# Patient Record
Sex: Male | Born: 1990 | Hispanic: Yes | Marital: Single | Smoking: Never smoker
Health system: Southern US, Community
[De-identification: ages and names within clinical notes are randomized; demographics above are authoritative.]

## PROBLEM LIST (undated history)

## (undated) DIAGNOSIS — E739 Lactose intolerance, unspecified: Secondary | ICD-10-CM

## (undated) DIAGNOSIS — K760 Fatty (change of) liver, not elsewhere classified: Secondary | ICD-10-CM

## (undated) DIAGNOSIS — R001 Bradycardia, unspecified: Secondary | ICD-10-CM

## (undated) DIAGNOSIS — E669 Obesity, unspecified: Secondary | ICD-10-CM

## (undated) DIAGNOSIS — U071 COVID-19: Secondary | ICD-10-CM

## (undated) DIAGNOSIS — K589 Irritable bowel syndrome without diarrhea: Secondary | ICD-10-CM

## (undated) DIAGNOSIS — K219 Gastro-esophageal reflux disease without esophagitis: Secondary | ICD-10-CM

## (undated) HISTORY — DX: Gastro-esophageal reflux disease without esophagitis: K21.9

## (undated) HISTORY — DX: COVID-19: U07.1

## (undated) HISTORY — DX: Obesity, unspecified: E66.9

## (undated) HISTORY — DX: Lactose intolerance, unspecified: E73.9

## (undated) HISTORY — DX: Irritable bowel syndrome, unspecified: K58.9

## (undated) HISTORY — PX: WISDOM TOOTH EXTRACTION: SHX21

## (undated) HISTORY — DX: Bradycardia, unspecified: R00.1

## (undated) HISTORY — DX: Fatty (change of) liver, not elsewhere classified: K76.0

---

## 2017-03-25 ENCOUNTER — Ambulatory Visit (HOSPITAL_COMMUNITY)
Admission: EM | Admit: 2017-03-25 | Discharge: 2017-03-25 | Disposition: A | Discharge status: Home / Self Care | Payer: 59 | Attending: Family Medicine | Admitting: Family Medicine

## 2017-03-25 ENCOUNTER — Encounter (HOSPITAL_COMMUNITY): Payer: Self-pay | Admitting: *Deleted

## 2017-03-25 DIAGNOSIS — T148XXA Other injury of unspecified body region, initial encounter: Secondary | ICD-10-CM

## 2017-03-25 DIAGNOSIS — M940 Chondrocostal junction syndrome [Tietze]: Secondary | ICD-10-CM | POA: Insufficient documentation

## 2017-03-25 DIAGNOSIS — M25512 Pain in left shoulder: Secondary | ICD-10-CM | POA: Diagnosis not present

## 2017-03-25 DIAGNOSIS — R51 Headache: Secondary | ICD-10-CM | POA: Diagnosis present

## 2017-03-25 DIAGNOSIS — J029 Acute pharyngitis, unspecified: Secondary | ICD-10-CM | POA: Insufficient documentation

## 2017-03-25 DIAGNOSIS — I889 Nonspecific lymphadenitis, unspecified: Secondary | ICD-10-CM | POA: Insufficient documentation

## 2017-03-25 LAB — POCT RAPID STREP A: Streptococcus, Group A Screen (Direct): NEGATIVE

## 2017-03-25 NOTE — ED Triage Notes (Signed)
Pt  Reports   sorethroat  For   Several  Days    With  Pain  When he   Swallows  Pt  Also  Reports  Pain l  Upper  Chest   When  He   Coughs   And  When  He  Breathes  Out

## 2017-03-25 NOTE — ED Provider Notes (Signed)
[facility]    CSN: [id number] Arrival date & time: 03/25/17  1803     History   Chief Complaint Chief Complaint  Patient presents with  . Sore Throat    HPI Shawn Mcknight is a 26 y.o. male.   27 year old male presents to the urgent care with a history of sore throat for a little over a week. Also having some headache. He adds additional complaints of pain in the left shoulder which is actually in the left lateral neck. He has complaints of pain along the bilateral costal margin. He has a concern about a small nodule in the right anterior cervical lymph node chain. He has no nausea, vomiting, diarrhea, fever or chills.      History reviewed. No pertinent past medical history.  There are no active problems to display for this patient.   History reviewed. No pertinent surgical history.     Home Medications    Prior to Admission medications   Medication Sig Start Date End Date Taking? Authorizing Provider  Diclofenac Sodium (VOLTAREN PO) Take by mouth.   Yes [provider]    Family History History reviewed. No pertinent family history.  Social History Social History  Substance Use Topics  . Smoking status: Never Smoker  . Smokeless tobacco: Never Used  . Alcohol use No     Allergies   Patient has no known allergies.   Review of Systems Review of Systems  Constitutional: Negative.  Negative for activity change, diaphoresis, fatigue and fever.  HENT: Positive for sore throat. Negative for congestion, ear pain, facial swelling, hearing loss, postnasal drip, rhinorrhea and trouble swallowing.   Eyes: Negative.  Negative for pain, discharge and redness.  Respiratory: Positive for cough. Negative for chest tightness and shortness of breath.   Cardiovascular: Positive for chest pain.  Gastrointestinal: Negative.   Musculoskeletal: Positive for myalgias and neck pain. Negative for neck stiffness.  Neurological: Negative.   Hematological:  Positive for adenopathy.  All other systems reviewed and are negative.    Physical Exam Triage Vital Signs ED Triage Vitals  Enc Vitals Group     BP 03/25/17 1909 128/74     Pulse Rate 03/25/17 1909 78     Resp 03/25/17 1909 18     Temp 03/25/17 1909 98.6 F (37 C)     Temp Source 03/25/17 1909 Oral     SpO2 03/25/17 1909 99 %     Weight --      Height --      Head Circumference --      Peak Flow --      Pain Score 03/25/17 1911 5     Pain Loc --      Pain Edu? --      Excl. in [place]? --    No data found.   Updated Vital Signs BP 128/74 (BP Location: Right Arm)   Pulse 78   Temp 98.6 F (37 C) (Oral)   Resp 18   SpO2 99%   Visual Acuity Right Eye Distance:   Left Eye Distance:   Bilateral Distance:    Right Eye Near:   Left Eye Near:    Bilateral Near:     Physical Exam  Constitutional: He is oriented to person, place, and time. He appears well-developed and well-nourished. No distress.  HENT:  Head: Normocephalic and atraumatic.  Oropharynx with minor erythema. No exudates or swelling.  Eyes: EOM are normal.  Neck: Normal range of motion. Neck supple.  1 solitary small 1/2 cm node in the rightneck.   Cardiovascular: Normal rate, regular rhythm and normal heart sounds.   Pulmonary/Chest: Effort normal and breath sounds normal.  Musculoskeletal:  Tenderness along the left scalene muscle. This is made worse by having him rotate to the right into elicited to the right. Palpation of this scalene muscle it causes tenderness and reproduces the pain.  Palpation of the bilateral lower costal margins reproduces the pain for which he complains.  Neurological: He is alert and oriented to person, place, and time.  Skin: Skin is dry.  Psychiatric: He has a normal mood and affect.  Nursing note and vitals reviewed.    UC Treatments / Results  Labs (all labs ordered are listed, but only abnormal results are displayed) Labs Reviewed  POCT RAPID STREP A    EKG   EKG Interpretation None       Radiology No results found.  Procedures Procedures (including critical care time)  Medications Ordered in UC Medications - No data to display   Initial Impression / Assessment and Plan / UC Course  I have reviewed the triage vital signs and the nursing notes.  Pertinent labs & imaging results that were available during my care of the patient were reviewed by me and considered in my medical decision making (see chart for details).    Strep test is negative. You likely have a viral sore throat. It will run its course. For pain he may take ibuprofen 600 mg every 6 hours, Cepacol lozenges for sore throat pain. If he do not have stomach problems the ibuprofen should also help with the inflammation of the chest wall in the muscle in your neck. Apply heat to your neck periodically. Apply ice to the ribs.     Final Clinical Impressions(s) / UC Diagnoses   Final diagnoses:  Sore throat  Costochondritis  Muscle strain  Lymphadenitis    New Prescriptions New Prescriptions   No medications on file     Controlled Substance Prescriptions [place] Controlled Substance Registry consulted? Not Applicable   Janne Napoleon, NP 03/25/17 [phone]

## 2017-03-25 NOTE — Discharge Instructions (Signed)
Strep test is negative. You likely have a viral sore throat. It will run its course. For pain he may take ibuprofen 600 mg every 6 hours, Cepacol lozenges for sore throat pain. If he do not have stomach problems the ibuprofen should also help with the inflammation of the chest wall in the muscle in your neck. Apply heat to your neck periodically. Apply ice to the ribs.

## 2017-03-28 LAB — CULTURE, GROUP A STREP (THRC)

## 2017-04-15 ENCOUNTER — Encounter (HOSPITAL_COMMUNITY): Payer: Self-pay | Admitting: *Deleted

## 2017-04-15 ENCOUNTER — Ambulatory Visit (HOSPITAL_COMMUNITY)
Admission: EM | Admit: 2017-04-15 | Discharge: 2017-04-15 | Disposition: A | Discharge status: Home / Self Care | Payer: 59 | Attending: Nurse Practitioner | Admitting: Nurse Practitioner

## 2017-04-15 DIAGNOSIS — G43011 Migraine without aura, intractable, with status migrainosus: Secondary | ICD-10-CM | POA: Diagnosis not present

## 2017-04-15 DIAGNOSIS — G43009 Migraine without aura, not intractable, without status migrainosus: Secondary | ICD-10-CM | POA: Diagnosis not present

## 2017-04-15 MED ORDER — DIPHENHYDRAMINE HCL 50 MG/ML IJ SOLN
12.5000 mg | Freq: Once | INTRAMUSCULAR | Status: AC
  Administered 2017-04-15: 12.5 mg via INTRAMUSCULAR

## 2017-04-15 MED ORDER — METOCLOPRAMIDE HCL 5 MG/ML IJ SOLN
10.0000 mg | Freq: Once | INTRAMUSCULAR | Status: AC
  Administered 2017-04-15: 10 mg via INTRAMUSCULAR

## 2017-04-15 MED ORDER — DIPHENHYDRAMINE HCL 50 MG/ML IJ SOLN
INTRAMUSCULAR | Status: AC
  Filled 2017-04-15: qty 1

## 2017-04-15 MED ORDER — KETOROLAC TROMETHAMINE 60 MG/2ML IM SOLN
60.0000 mg | Freq: Once | INTRAMUSCULAR | Status: AC
  Administered 2017-04-15: 60 mg via INTRAMUSCULAR

## 2017-04-15 MED ORDER — METOCLOPRAMIDE HCL 5 MG/ML IJ SOLN
INTRAMUSCULAR | Status: AC
Start: 2017-04-15 — End: 2017-04-15
  Filled 2017-04-15: qty 2

## 2017-04-15 MED ORDER — KETOROLAC TROMETHAMINE 60 MG/2ML IM SOLN
INTRAMUSCULAR | Status: AC
Start: 2017-04-15 — End: 2017-04-15
  Filled 2017-04-15: qty 2

## 2017-04-15 NOTE — ED Provider Notes (Addendum)
[place]    CSN: [id number] Arrival date & time: 04/15/17  1737     History   Chief Complaint Chief Complaint  Patient presents with  . Headache    HPI Shawn Mcknight is a 26 y.o. male.   Subjective:  Shawn Mcknight is a 26 y.o. male who presents for evaluation of headache. Symptoms began about 4 days ago. Rapidity of onset was gradual. The patient gets headaches rarely. The headache is described as squeezing and throbbing and is occipital, frontal in location.  The patient rates the pain a 8 on a scale from 1 to 10. Precipitating factors include none which have been determined. The headache was not preceded by an aura. Associated neurologic symptoms which are present include dizziness and vision problems. The patient denies loss of balance, muscle weakness, numbness of extremities and speech difficulties. Other associated symptoms include nausea and photophobia.  Patient denies abdominal pain, chest pain, earache, fever, neck stiffness, rash, rhinorrhea and vomiting. Home treatment has included acetaminophen with inadequate improvement. Other history includes: nothing pertinent. Family history includes no known family members with significant headaches.  The following portions of the patient's history were reviewed and updated as appropriate: allergies, current medications, past family history, past medical history, past social history, past surgical history and problem list.         History reviewed. No pertinent past medical history.  There are no active problems to display for this patient.   History reviewed. No pertinent surgical history.     Home Medications    Prior to Admission medications   Medication Sig Start Date End Date Taking? Authorizing Provider  Diclofenac Sodium (VOLTAREN PO) Take by mouth.    [provider]    Family History History reviewed. No pertinent family history.  Social History Social History  Substance Use  Topics  . Smoking status: Never Smoker  . Smokeless tobacco: Never Used  . Alcohol use No     Allergies   Patient has no known allergies.   Review of Systems Review of Systems  Constitutional: Negative for fever.  HENT: Negative for ear pain, sinus pain and sinus pressure.   Eyes: Negative for pain.  Musculoskeletal: Negative for neck pain and neck stiffness.  Skin: Negative for rash.  Neurological: Positive for light-headedness and headaches. Negative for dizziness, tremors, speech difficulty, weakness and numbness.  All other systems reviewed and are negative.    Physical Exam Triage Vital Signs ED Triage Vitals  Enc Vitals Group     BP 04/15/17 1800 132/78     Pulse Rate 04/15/17 1800 78     Resp 04/15/17 1800 18     Temp 04/15/17 1800 98.6 F (37 C)     Temp Source 04/15/17 1800 Oral     SpO2 04/15/17 1800 100 %     Weight --      Height --      Head Circumference --      Peak Flow --      Pain Score 04/15/17 1802 6     Pain Loc --      Pain Edu? --      Excl. in [place]? --    No data found.   Updated Vital Signs BP 132/78 (BP Location: Right Arm)   Pulse 78   Temp 98.6 F (37 C) (Oral)   Resp 18   SpO2 100%   Visual Acuity Right Eye Distance:   Left Eye Distance:   Bilateral Distance:  Right Eye Near:   Left Eye Near:    Bilateral Near:     Physical Exam  Constitutional: He is oriented to person, place, and time. He appears well-developed and well-nourished.  HENT:  Head: Normocephalic.  Right Ear: External ear normal.  Left Ear: External ear normal.  Eyes: Pupils are equal, round, and reactive to light. Conjunctivae and EOM are normal.  Neck: Normal range of motion. Neck supple.  Cardiovascular: Normal rate, regular rhythm and normal heart sounds.   Pulmonary/Chest: Effort normal and breath sounds normal.  Musculoskeletal: Normal range of motion.  Neurological: He is alert and oriented to person, place, and time.  Skin: Skin is warm and  dry.  Psychiatric: He has a normal mood and affect.     UC Treatments / Results  Labs (all labs ordered are listed, but only abnormal results are displayed) Labs Reviewed - No data to display  EKG  EKG Interpretation None       Radiology No results found.  Procedures Procedures (including critical care time)  Medications Ordered in UC Medications  ketorolac (TORADOL) injection 60 mg (not administered)  metoCLOPramide (REGLAN) injection 10 mg (not administered)  diphenhydrAMINE (BENADRYL) injection 12.5 mg (not administered)     Initial Impression / Assessment and Plan / UC Course  I have reviewed the triage vital signs and the nursing notes.  Pertinent labs & imaging results that were available during my care of the patient were reviewed by me and considered in my medical decision making (see chart for details).   26 year old male with no significant medical history that presents with a four-day history of headache likely due to complex migraine. No systemic symptoms, focal neuro logical deficits, head trauma, toxic exposures or previous significant headache history. Low suspicion for meningitis. No indication for imaging or LP at this time. Treated in clinic with Toradol, benadryl and Reglan with significant improvement in symptoms.   Discussed diagnosis and treatment with patient. All questions have been answered and all concerns have been addressed. The patient verbalized understanding and had no further questions   Addendum 1917: Patient was treated in the clinic as above with significant improvement in his symptoms. He was discharged in stable condition. Within minutes of leaving the clinic, patient returns stating that his headache is back. Advised patient to go to the ED for further evaluation. He is alert and oriented 3. No focal deficits noted. Ambulatory with steady gait.  Final Clinical Impressions(s) / UC Diagnoses   Final diagnoses:  Intractable migraine  without aura and with status migrainosus    New Prescriptions New Prescriptions   No medications on file     Controlled Substance Prescriptions [place] Controlled Substance Registry consulted? Not Applicable   Enrique Sack, [place] 04/15/17 [place], [place], [place] 04/15/17 [phone]

## 2017-04-15 NOTE — ED Triage Notes (Signed)
Pt   Reports    Headache      X  4  Days       With  Some  Nausea      Pt   Reports     Symptoms   Not  releived    By   otc      meds       He  Is   Awake  And  Alert

## 2019-06-07 ENCOUNTER — Encounter (HOSPITAL_COMMUNITY): Payer: Self-pay

## 2019-06-07 ENCOUNTER — Ambulatory Visit (HOSPITAL_COMMUNITY)
Admission: EM | Admit: 2019-06-07 | Discharge: 2019-06-07 | Disposition: A | Discharge status: Home / Self Care | Payer: PRIVATE HEALTH INSURANCE | Attending: Family Medicine | Admitting: Family Medicine

## 2019-06-07 ENCOUNTER — Other Ambulatory Visit: Payer: Self-pay

## 2019-06-07 ENCOUNTER — Ambulatory Visit (INDEPENDENT_AMBULATORY_CARE_PROVIDER_SITE_OTHER): Payer: PRIVATE HEALTH INSURANCE

## 2019-06-07 DIAGNOSIS — R1030 Lower abdominal pain, unspecified: Secondary | ICD-10-CM

## 2019-06-07 DIAGNOSIS — K5900 Constipation, unspecified: Secondary | ICD-10-CM | POA: Diagnosis present

## 2019-06-07 DIAGNOSIS — K219 Gastro-esophageal reflux disease without esophagitis: Secondary | ICD-10-CM | POA: Insufficient documentation

## 2019-06-07 LAB — CBC WITH DIFFERENTIAL/PLATELET
Abs Immature Granulocytes: 0.05 10*3/uL (ref 0.00–0.07)
Basophils Absolute: 0 10*3/uL (ref 0.0–0.1)
Basophils Relative: 1 %
Eosinophils Absolute: 0.1 10*3/uL (ref 0.0–0.5)
Eosinophils Relative: 2 %
HCT: 48.7 % (ref 39.0–52.0)
Hemoglobin: 16.4 g/dL (ref 13.0–17.0)
Immature Granulocytes: 1 %
Lymphocytes Relative: 29 %
Lymphs Abs: 1.9 10*3/uL (ref 0.7–4.0)
MCH: 29.7 pg (ref 26.0–34.0)
MCHC: 33.7 g/dL (ref 30.0–36.0)
MCV: 88.2 fL (ref 80.0–100.0)
Monocytes Absolute: 0.5 10*3/uL (ref 0.1–1.0)
Monocytes Relative: 8 %
Neutro Abs: 3.8 10*3/uL (ref 1.7–7.7)
Neutrophils Relative %: 59 %
Platelets: 274 10*3/uL (ref 150–400)
RBC: 5.52 MIL/uL (ref 4.22–5.81)
RDW: 12.6 % (ref 11.5–15.5)
WBC: 6.4 10*3/uL (ref 4.0–10.5)
nRBC: 0 % (ref 0.0–0.2)

## 2019-06-07 LAB — COMPREHENSIVE METABOLIC PANEL
ALT: 311 U/L — ABNORMAL HIGH (ref 0–44)
AST: 157 U/L — ABNORMAL HIGH (ref 15–41)
Albumin: 4.1 g/dL (ref 3.5–5.0)
Alkaline Phosphatase: 85 U/L (ref 38–126)
Anion gap: 11 (ref 5–15)
BUN: 13 mg/dL (ref 6–20)
CO2: 26 mmol/L (ref 22–32)
Calcium: 9.6 mg/dL (ref 8.9–10.3)
Chloride: 102 mmol/L (ref 98–111)
Creatinine, Ser: 0.94 mg/dL (ref 0.61–1.24)
GFR calc Af Amer: >60 mL/min (ref >60)
GFR calc non Af Amer: >60 mL/min (ref >60)
Glucose, Bld: 104 mg/dL — ABNORMAL HIGH (ref 70–99)
Potassium: 3.9 mmol/L (ref 3.5–5.1)
Sodium: 139 mmol/L (ref 135–145)
Total Bilirubin: 1 mg/dL (ref 0.3–1.2)
Total Protein: 7.3 g/dL (ref 6.5–8.1)

## 2019-06-07 LAB — LIPASE, BLOOD: Lipase: 34 U/L (ref 11–51)

## 2019-06-07 MED ORDER — OMEPRAZOLE 40 MG PO CPDR: 40.0000 mg | DELAYED_RELEASE_CAPSULE | Freq: Every day | ORAL | 0 refills | Status: DC

## 2019-06-07 MED ORDER — SIMETHICONE 125 MG PO CHEW: 125.0000 mg | CHEWABLE_TABLET | Freq: Four times a day (QID) | ORAL | 0 refills | Status: DC | PRN

## 2019-06-07 MED ORDER — POLYETHYLENE GLYCOL 3350 17 GM/SCOOP PO POWD: 17.0000 g | Freq: Every day | ORAL | 0 refills | Status: DC
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
## 2019-06-07 NOTE — ED Provider Notes (Signed)
[facility]    CSN: [id number] Arrival date & time: 06/07/19  [id number]      History   Chief Complaint Chief Complaint  Patient presents with  . Abdominal Pain    HPI Shawn Mcknight is a 28 y.o. male.   Shawn Mcknight presents with complaints of abdominal pain which started 11/8. States he developed upper abdominal pain after eating salsa initially. Since then he has had general abdominal pain, and now worse to lower abdomen, as well as straining to pass stool. Stool has been yellow mucus in color. Only small amount of stool. He feels constipated. Yesterday felt nauseated. No current nausea. No vomiting. Wasn't able to eat due to nausea. No back pain. No blood or black in stool. No urinary symptoms. Two days ago he felt chills and worse with sitting upright, tmax of 99. Pain 5/10 in severity. No body aches or URI symptoms. Hasn't taken any medications for symptoms. States he was told he has colitis in the past by his physician in [place], he has not been able to follow with them, unfortunately. He states he was prescribed a medication called Alevian but he doesn't have this currently. It looks like, by online search, this is a simethicone medication. Patient feels bloated. He is currently working from home.    ROS per HPI, negative if not otherwise mentioned.      History reviewed. No pertinent past medical history.  There are no active problems to display for this patient.   Past Surgical History:  Procedure Laterality Date  . WISDOM TOOTH EXTRACTION         Home Medications    Prior to Admission medications   Medication Sig Start Date End Date Taking? Authorizing Provider  Diclofenac Sodium (VOLTAREN PO) Take by mouth.    [provider]  omeprazole (PRILOSEC) 40 MG capsule Take 1 capsule (40 mg total) by mouth daily. 06/07/19 07/07/19  Augusto Gamble B, NP  polyethylene glycol powder (GLYCOLAX/MIRALAX) 17 GM/SCOOP powder Take 17 g by mouth daily.  06/07/19   Zigmund Gottron, NP  simethicone (MYLICON) 0000000 MG chewable tablet Chew 1 tablet (125 mg total) by mouth every 6 (six) hours as needed for flatulence (bloating). 06/07/19   Zigmund Gottron, NP    Family History Family History  Problem Relation Age of Onset  . Hypertension Mother   . Healthy Father   . Cancer Sister   . Thyroid disease Sister     Social History Social History   Tobacco Use  . Smoking status: Never Smoker  . Smokeless tobacco: Never Used  Substance Use Topics  . Alcohol use: No  . Drug use: Not on file     Allergies   Patient has no known allergies.   Review of Systems Review of Systems   Physical Exam Triage Vital Signs ED Triage Vitals  Enc Vitals Group     BP 06/07/19 0913 132/60     Pulse Rate 06/07/19 0913 64     Resp 06/07/19 0913 16     Temp 06/07/19 0913 97.9 F (36.6 C)     Temp Source 06/07/19 0913 Temporal     SpO2 06/07/19 0913 99 %     Weight 06/07/19 0925 238 lb 1.6 oz (108 kg)     Height --      Head Circumference --      Peak Flow --      Pain Score 06/07/19 0923 6     Pain Loc --  Pain Edu? --      Excl. in [place]? --    No data found.  Updated Vital Signs BP 132/60 (BP Location: Left Arm)   Pulse 64   Temp 97.9 F (36.6 C) (Temporal)   Resp 16   Wt 238 lb 1.6 oz (108 kg)   SpO2 99%    Physical Exam Constitutional:      Appearance: He is well-developed. He is obese.  Cardiovascular:     Rate and Rhythm: Normal rate.  Pulmonary:     Effort: Pulmonary effort is normal.  Abdominal:     General: Bowel sounds are decreased.     Palpations: Abdomen is soft.     Tenderness: There is abdominal tenderness in the right lower quadrant, suprapubic area, left upper quadrant and left lower quadrant. There is no right CVA tenderness, left CVA tenderness or rebound.  Skin:    General: Skin is warm and dry.  Neurological:     Mental Status: He is alert and oriented to person, place, and time.      UC  Treatments / Results  Labs (all labs ordered are listed, but only abnormal results are displayed) Labs Reviewed  COMPREHENSIVE METABOLIC PANEL  CBC WITH DIFFERENTIAL/PLATELET  LIPASE, BLOOD    EKG   Radiology Dg Abd 2 Views  Result Date: 06/07/2019 CLINICAL DATA:  Abdominal pain, constipation. EXAM: ABDOMEN - 2 VIEW COMPARISON:  None. FINDINGS: The bowel gas pattern is normal. Moderate amount of stool seen in the colon. There is no evidence of free air. No radio-opaque calculi or other significant radiographic abnormality is seen. IMPRESSION: No evidence of bowel obstruction or ileus. Moderate stool burden is noted. Electronically Signed   By: Marijo Conception M.D.   On: 06/07/2019 10:03    Procedures Procedures (including critical care time)  Medications Ordered in UC Medications - No data to display  Initial Impression / Assessment and Plan / UC Course  I have reviewed the triage vital signs and the nursing notes.  Pertinent labs & imaging results that were available during my care of the patient were reviewed by me and considered in my medical decision making (see chart for details).     Non toxic in appearance. Afebrile, no tachycardia. Non specific and mild abdominal pain, generalized to lower abdomen with mild LUQ pain. No vomiting. Non obstructive pattern on xray, with some stool burden, consistent with complaint of constipation. Symptoms of gerd as well. Simethicone has worked in the past, refilled here today. miralax and omeprazole also recommended. CBC, CMP and lipase pending. Will call patient with any concerning findings. Return precautions provided.  Encouraged establish with pcp. Patient verbalized understanding and agreeable to plan.  Ambulatory out of clinic without difficulty.    Final Clinical Impressions(s) / UC Diagnoses   Final diagnoses:  Gastroesophageal reflux disease, unspecified whether esophagitis present  Constipation, unspecified constipation type   Lower abdominal pain     Discharge Instructions     Your xray is reassuring, you do have a fair amount of stool present, however.  Miralax daily,  may use twice today if needed to promote large bowel movement.  Drink plenty of water, approximately 8 glasses a day.  May use simethicone chews as needed for bloating or gas.  Please start omeprazole daily to help with upper abdominal pain as well, see diet recommendations.  I will call you if I am concerned about your lab testing.  Please call to establish care with a primary care  provider to establish care and for recheck and management long term.  Any worsening of pain, fevers, blood or black in stool or otherwise worsening please go to the ER.     ED Prescriptions    Medication Sig Dispense Auth. Provider   simethicone (MYLICON) 0000000 MG chewable tablet Chew 1 tablet (125 mg total) by mouth every 6 (six) hours as needed for flatulence (bloating). 30 tablet Augusto Gamble B, NP   omeprazole (PRILOSEC) 40 MG capsule Take 1 capsule (40 mg total) by mouth daily. 30 capsule Augusto Gamble B, NP   polyethylene glycol powder (GLYCOLAX/MIRALAX) 17 GM/SCOOP powder Take 17 g by mouth daily. 255 g Zigmund Gottron, NP     PDMP not reviewed this encounter.   Zigmund Gottron, NP 06/07/19 1025

## 2019-06-07 NOTE — Discharge Instructions (Signed)
Your xray is reassuring, you do have a fair amount of stool present, however.  Miralax daily,  may use twice today if needed to promote large bowel movement.  Drink plenty of water, approximately 8 glasses a day.  May use simethicone chews as needed for bloating or gas.  Please start omeprazole daily to help with upper abdominal pain as well, see diet recommendations.  I will call you if I am concerned about your lab testing.  Please call to establish care with a primary care provider to establish care and for recheck and management long term.  Any worsening of pain, fevers, blood or black in stool or otherwise worsening please go to the ER.

## 2019-06-09 ENCOUNTER — Telehealth (HOSPITAL_COMMUNITY): Payer: Self-pay | Admitting: Emergency Medicine

## 2019-06-09 NOTE — Telephone Encounter (Signed)
Elevated liver enzymes. Spoke to [employer], pt has appt with PCP in December. Per Lanelle Bal, pt is okay to wait unless he gets worse.  If pt has significant abdominal pain, having fevers, go to ER otherwise follow up with PCP.  Patient contacted and made aware of    results. Pt verbalized understanding and had all questions answered.

## 2019-07-03 ENCOUNTER — Encounter: Payer: Self-pay | Admitting: Family Medicine

## 2019-07-03 ENCOUNTER — Other Ambulatory Visit: Payer: Self-pay | Admitting: Family Medicine

## 2019-07-03 ENCOUNTER — Other Ambulatory Visit: Payer: Self-pay

## 2019-07-03 ENCOUNTER — Ambulatory Visit (INDEPENDENT_AMBULATORY_CARE_PROVIDER_SITE_OTHER): Payer: 59 | Admitting: Family Medicine

## 2019-07-03 VITALS — BP 138/82 | HR 63 | Temp 96.0°F | Ht 65.0 in | Wt 239.0 lb

## 2019-07-03 DIAGNOSIS — R7989 Other specified abnormal findings of blood chemistry: Secondary | ICD-10-CM

## 2019-07-03 DIAGNOSIS — S93409A Sprain of unspecified ligament of unspecified ankle, initial encounter: Secondary | ICD-10-CM | POA: Insufficient documentation

## 2019-07-03 DIAGNOSIS — S93402A Sprain of unspecified ligament of left ankle, initial encounter: Secondary | ICD-10-CM | POA: Diagnosis not present

## 2019-07-03 DIAGNOSIS — R1011 Right upper quadrant pain: Secondary | ICD-10-CM | POA: Diagnosis not present

## 2019-07-03 LAB — BASIC METABOLIC PANEL
BUN: 17 mg/dL (ref 6–23)
CO2: 26 mEq/L (ref 19–32)
Calcium: 10.1 mg/dL (ref 8.4–10.5)
Chloride: 102 mEq/L (ref 96–112)
Creatinine, Ser: 0.89 mg/dL (ref 0.40–1.50)
GFR: 101.84 mL/min (ref >60.00)
Glucose, Bld: 102 mg/dL — ABNORMAL HIGH (ref 70–99)
Potassium: 4.6 mEq/L (ref 3.5–5.1)
Sodium: 140 mEq/L (ref 135–145)

## 2019-07-03 LAB — HEPATIC FUNCTION PANEL
ALT: 267 U/L — ABNORMAL HIGH (ref 0–53)
AST: 91 U/L — ABNORMAL HIGH (ref 0–37)
Albumin: 4.8 g/dL (ref 3.5–5.2)
Alkaline Phosphatase: 99 U/L (ref 39–117)
Bilirubin, Direct: 0.1 mg/dL (ref 0.0–0.3)
Total Bilirubin: 0.6 mg/dL (ref 0.2–1.2)
Total Protein: 7.3 g/dL (ref 6.0–8.3)

## 2019-07-03 NOTE — Assessment & Plan Note (Addendum)
Instructed to continue light exercises/stretches and use of brace.  Ice as needed.  Call if not improving over the next few weeks.

## 2019-07-03 NOTE — Assessment & Plan Note (Signed)
Update LFT's today  RUQ [place] ordered as he does have some tenderness over this area.

## 2019-07-03 NOTE — Patient Instructions (Signed)
Gallbladder Eating Plan °If you have a gallbladder condition, you may have trouble digesting fats. Eating a low-fat diet can help reduce your symptoms, and may be helpful before and after having surgery to remove your gallbladder (cholecystectomy). Your health care provider may recommend that you work with a diet and nutrition specialist (dietitian) to help you reduce the amount of fat in your diet. °What are tips for following this plan? °General guidelines °· Limit your fat intake to less than 30% of your total daily calories. If you eat around 1,800 calories each day, this is less than 60 grams (g) of fat per day. °· Fat is an important part of a healthy diet. Eating a low-fat diet can make it hard to maintain a healthy body weight. Ask your dietitian how much fat, calories, and other nutrients you need each day. °· Eat small, frequent meals throughout the day instead of three large meals. °· Drink at least 8-10 cups of fluid a day. Drink enough fluid to keep your urine clear or pale yellow. °· Limit alcohol intake to no more than 1 drink a day for nonpregnant women and 2 drinks a day for men. One drink equals 12 oz of beer, 5 oz of wine, or 1½ oz of hard liquor. °Reading food labels °· Check Nutrition Facts on food labels for the amount of fat per serving. Choose foods with less than 3 grams of fat per serving. °Shopping °· Choose nonfat and low-fat healthy foods. Look for the words “nonfat,” “low fat,” or “fat free.” °· Avoid buying processed or prepackaged foods. °Cooking °· Cook using low-fat methods, such as baking, broiling, grilling, or boiling. °· Cook with small amounts of healthy fats, such as olive oil, grapeseed oil, canola oil, or sunflower oil. °What foods are recommended? ° °· All fresh, frozen, or canned fruits and vegetables. °· Whole grains. °· Low-fat or non-fat (skim) milk and yogurt. °· Lean meat, skinless poultry, fish, eggs, and beans. °· Low-fat protein supplement powders or  drinks. °· Spices and herbs. °What foods are not recommended? °· High-fat foods. These include baked goods, fast food, fatty cuts of meat, ice cream, french toast, sweet rolls, pizza, cheese bread, foods covered with butter, creamy sauces, or cheese. °· Fried foods. These include french fries, tempura, battered fish, breaded chicken, fried breads, and sweets. °· Foods with strong odors. °· Foods that cause bloating and gas. °Summary °· A low-fat diet can be helpful if you have a gallbladder condition, or before and after gallbladder surgery. °· Limit your fat intake to less than 30% of your total daily calories. This is about 60 g of fat if you eat 1,800 calories each day. °· Eat small, frequent meals throughout the day instead of three large meals. °This information is not intended to replace advice given to you by your health care provider. Make sure you discuss any questions you have with your health care provider. °Document Released: 07/18/2013 Document Revised: 11/03/2018 Document Reviewed: 08/20/2016 °Elsevier Patient Education © 2020 [employer] ° °

## 2019-07-03 NOTE — Progress Notes (Signed)
Shawn Mcknight - 28 y.o. male MRN [id number]  Date of birth: 1991-07-27  Subjective Chief Complaint  Patient presents with  . New Patient (Initial Visit)    Establish Care - discuss recent abn labs (liver fx)    HPI Shawn Mcknight is a 28 y.o. male here today for follow up of recent urgent care visit.  He was seen last month for complaint of constipation.  Labs showed elevated LFT's.  Lipase was normal at that time.  He has had some pain throughout his abdomen, most of this has resolved with treating his constipation.  He continues to have some RUQ pain.  This is made worse by fatty foods, especially cheese.  He think he may have lactose intolerance causing this.   He denies fever, chills, jaundice, dark urine, herbal supplements, excess tylenol use or EtOH use.    Also has L ankle pain.  Reports rolling his ankle about 1 week ago.  Mild swelling and still with some pain.  He is able to weight bear and walk without difficulty.  He is using an elastic ankle brace.   ROS:  A comprehensive ROS was completed and negative except as noted per HPI  Allergies  Allergen Reactions  . Lactose Intolerance (Gi)   . Shellfish Allergy Swelling    Past Medical History:  Diagnosis Date  . Bradycardia   . Lactose intolerance     Past Surgical History:  Procedure Laterality Date  . WISDOM TOOTH EXTRACTION      Social History   Socioeconomic History  . Marital status: Significant Other    Spouse name: Not on file  . Number of children: Not on file  . Years of education: Not on file  . Highest education level: Not on file  Occupational History  . Not on file  Social Needs  . Financial resource strain: Not on file  . Food insecurity    Worry: Not on file    Inability: Not on file  . Transportation needs    Medical: Not on file    Non-medical: Not on file  Tobacco Use  . Smoking status: Never Smoker  . Smokeless tobacco: Never Used  Substance and Sexual Activity  . Alcohol use: No  .  Drug use: Never  . Sexual activity: Yes  Lifestyle  . Physical activity    Days per week: Not on file    Minutes per session: Not on file  . Stress: Not on file  Relationships  . Social Herbalist on phone: Not on file    Gets together: Not on file    Attends religious service: Not on file    Active member of club or organization: Not on file    Attends meetings of clubs or organizations: Not on file    Relationship status: Not on file  Other Topics Concern  . Not on file  Social History Narrative  . Not on file    Family History  Problem Relation Age of Onset  . Hypertension Mother   . Healthy Father   . Thyroid disease Sister   . Thyroid cancer Sister     Health Maintenance  Topic Date Due  . HIV Screening  08/01/2006  . TETANUS/TDAP  08/01/2010  . INFLUENZA VACCINE  02/25/2019    ----------------------------------------------------------------------------------------------------------------------------------------------------------------------------------------------------------------- Physical Exam BP 138/82 (BP Location: Right Arm, Patient Position: Sitting, Cuff Size: Large)   Pulse 63   Temp (!) 96 F (35.6 C) (Temporal)   Ht 5'  5" (1.651 m)   Wt 239 lb (108.4 kg)   SpO2 97%   BMI 39.77 kg/m   Physical Exam Constitutional:      Appearance: Normal appearance.  HENT:     Head: Normocephalic and atraumatic.  Eyes:     General: No scleral icterus. Cardiovascular:     Rate and Rhythm: Normal rate and regular rhythm.  Pulmonary:     Effort: Pulmonary effort is normal.     Breath sounds: Normal breath sounds.  Abdominal:     General: Abdomen is flat. There is no distension.     Palpations: Abdomen is soft. There is no mass.     Tenderness: There is abdominal tenderness (RUQ). There is no guarding.  Musculoskeletal:     Comments: Mild swelling of L ankle. ROM of L ankle is normal.  Mild pain with inversion.    Skin:    General: Skin is warm  and dry.  Neurological:     General: No focal deficit present.     Mental Status: He is alert.  Psychiatric:        Mood and Affect: Mood normal.        Behavior: Behavior normal.     ------------------------------------------------------------------------------------------------------------------------------------------------------------------------------------------------------------------- Assessment and Plan  Elevated LFTs Update LFT's today  RUQ [place] ordered as he does have some tenderness over this area.  Ankle sprain Instructed to continue light exercises/stretches and use of brace.  Ice as needed.  Call if not improving over the next few weeks.

## 2019-07-04 ENCOUNTER — Encounter: Payer: Self-pay | Admitting: Family Medicine

## 2019-07-11 ENCOUNTER — Telehealth: Payer: Self-pay | Admitting: Family Medicine

## 2019-07-11 NOTE — Telephone Encounter (Signed)
Amin, please check on order that was placed on Dec. 7th  Copied from [place] [phone] [id number]. Topic: Referral - Status >> Jul 11, 2019  2:59 PM Lennox Solders wrote: Reason for CRM: Pt is calling checking on the status of abd ultrasound

## 2019-07-13 ENCOUNTER — Other Ambulatory Visit: Payer: Self-pay

## 2019-07-13 ENCOUNTER — Ambulatory Visit (HOSPITAL_BASED_OUTPATIENT_CLINIC_OR_DEPARTMENT_OTHER)
Admission: RE | Admit: 2019-07-13 | Discharge: 2019-07-13 | Disposition: A | Discharge status: Home / Self Care | Payer: 59 | Source: Ambulatory Visit | Attending: Family Medicine | Admitting: Family Medicine

## 2019-07-13 DIAGNOSIS — R1011 Right upper quadrant pain: Secondary | ICD-10-CM

## 2019-07-13 DIAGNOSIS — R7989 Other specified abnormal findings of blood chemistry: Secondary | ICD-10-CM | POA: Diagnosis present

## 2019-07-17 ENCOUNTER — Encounter: Payer: Self-pay | Admitting: Family Medicine

## 2019-07-18 NOTE — Telephone Encounter (Signed)
Pt scheduled for vv  

## 2019-07-18 NOTE — Telephone Encounter (Signed)
Ok to schedule virtual visit to discuss results.

## 2019-07-19 ENCOUNTER — Encounter: Payer: Self-pay | Admitting: Family Medicine

## 2019-07-19 ENCOUNTER — Telehealth (INDEPENDENT_AMBULATORY_CARE_PROVIDER_SITE_OTHER): Payer: 59 | Admitting: Family Medicine

## 2019-07-19 VITALS — HR 91 | Ht 65.0 in | Wt 233.0 lb

## 2019-07-19 DIAGNOSIS — R7989 Other specified abnormal findings of blood chemistry: Secondary | ICD-10-CM | POA: Diagnosis not present

## 2019-07-19 MED ORDER — OMEPRAZOLE 40 MG PO CPDR: 40.0000 mg | DELAYED_RELEASE_CAPSULE | Freq: Every day | ORAL | 1 refills | Status: DC

## 2019-07-19 NOTE — Assessment & Plan Note (Signed)
[place] consistent with NAFLD however enzymes are a little higher than what I would expect with this. Discussed low fat diet with weight loss.  Avoidance of any hepatotoxic substances (he denies EtOH, herbals, tylenol).  We'll recheck LFT's again in 2 weeks to be sure these are down-trending as well and will check hepatitis panel at that time.  If these remain elevated we discussed referral to GI.

## 2019-07-19 NOTE — Progress Notes (Signed)
Shawn Mcknight - 28 y.o. male MRN [id number]  Date of birth: 15-Jan-1991   This visit type was conducted due to national recommendations for restrictions regarding the COVID-19 Pandemic (e.g. social distancing).  This format is felt to be most appropriate for this patient at this time.  All issues noted in this document were discussed and addressed.  No physical exam was performed (except for noted visual exam findings with Video Visits).  I discussed the limitations of evaluation and management by telemedicine and the availability of in person appointments. The patient expressed understanding and agreed to proceed.  I connected with@ on 07/19/19 at 10:20 AM EST by a video enabled telemedicine application and verified that I am speaking with the correct person using two identifiers.  Present at visit: [name], DO [name]   Patient Location: Home [place] [address]   Provider location:   [place]  Chief Complaint  Patient presents with  . U/S report    HPI  Shawn Mcknight is a 28 y.o. male who presents via audio/video conferencing for a telehealth visit today.  He is following up on results of abdominal US.  Abd [place] ordered for elevated LFT's.  Results show findings consistent with NAFLD but no other abnormalities.  He reports that he has felt fine recently.  LFT's were trending down at last visit.  He denies any abdominal pain, nausea, changes to bowels, jaundice.     ROS:  A comprehensive ROS was completed and negative except as noted per HPI  Past Medical History:  Diagnosis Date  . Bradycardia   . Lactose intolerance     Past Surgical History:  Procedure Laterality Date  . WISDOM TOOTH EXTRACTION      Family History  Problem Relation Age of Onset  . Hypertension Mother   . Healthy Father   . Thyroid disease Sister   . Thyroid cancer Sister     Social History   Socioeconomic History  . Marital status:  Significant Other    Spouse name: Not on file  . Number of children: Not on file  . Years of education: Not on file  . Highest education level: Not on file  Occupational History  . Not on file  Tobacco Use  . Smoking status: Never Smoker  . Smokeless tobacco: Never Used  Substance and Sexual Activity  . Alcohol use: No  . Drug use: Never  . Sexual activity: Yes  Other Topics Concern  . Not on file  Social History Narrative  . Not on file   Social Determinants of Health   Financial Resource Strain:   . Difficulty of Paying Living Expenses: Not on file  Food Insecurity:   . Worried About Charity fundraiser in the Last Year: Not on file  . Ran Out of Food in the Last Year: Not on file  Transportation Needs:   . Lack of Transportation (Medical): Not on file  . Lack of Transportation (Non-Medical): Not on file  Physical Activity:   . Days of Exercise per Week: Not on file  . Minutes of Exercise per Session: Not on file  Stress:   . Feeling of Stress : Not on file  Social Connections:   . Frequency of Communication with Friends and Family: Not on file  . Frequency of Social Gatherings with Friends and Family: Not on file  . Attends Religious Services: Not on file  . Active Member of Clubs or Organizations:  Not on file  . Attends Archivist Meetings: Not on file  . Marital Status: Not on file  Intimate Partner Violence:   . Fear of Current or Ex-Partner: Not on file  . Emotionally Abused: Not on file  . Physically Abused: Not on file  . Sexually Abused: Not on file     Current Outpatient Medications:  .  omeprazole (PRILOSEC) 40 MG capsule, Take 1 capsule (40 mg total) by mouth daily., Disp: 30 capsule, Rfl: 1 .  simethicone (MYLICON) 0000000 MG chewable tablet, Chew 1 tablet (125 mg total) by mouth every 6 (six) hours as needed for flatulence (bloating). (Patient not taking: Reported on 07/19/2019), Disp: 30 tablet, Rfl: 0  EXAM:  VITALS per patient if  applicable: Pulse 91   Ht 5\' 5"  (1.651 m)   Wt 233 lb (105.7 kg)   BMI 38.77 kg/m   GENERAL: alert, oriented, appears well and in no acute distress  HEENT: atraumatic, conjunttiva clear, no obvious abnormalities on inspection of external nose and ears  NECK: normal movements of the head and neck  LUNGS: on inspection no signs of respiratory distress, breathing rate appears normal, no obvious gross SOB, gasping or wheezing  CV: no obvious cyanosis  MS: moves all visible extremities without noticeable abnormality  PSYCH/NEURO: pleasant and cooperative, no obvious depression or anxiety, speech and thought processing grossly intact  ASSESSMENT AND PLAN:  Discussed the following assessment and plan:  Elevated LFTs [place] consistent with NAFLD however enzymes are a little higher than what I would expect with this. Discussed low fat diet with weight loss.  Avoidance of any hepatotoxic substances (he denies EtOH, herbals, tylenol).  We'll recheck LFT's again in 2 weeks to be sure these are down-trending as well and will check hepatitis panel at that time.  If these remain elevated we discussed referral to GI.       I discussed the assessment and treatment plan with the patient. The patient was provided an opportunity to ask questions and all were answered. The patient agreed with the plan and demonstrated an understanding of the instructions.   The patient was advised to call back or seek an in-person evaluation if the symptoms worsen or if the condition fails to improve as anticipated.    [name], DO

## 2019-07-23 ENCOUNTER — Emergency Department (HOSPITAL_COMMUNITY)
Admission: EM | Admit: 2019-07-23 | Discharge: 2019-07-24 | Disposition: A | Discharge status: Home / Self Care | Payer: 59 | Attending: Emergency Medicine | Admitting: Emergency Medicine

## 2019-07-23 ENCOUNTER — Other Ambulatory Visit: Payer: Self-pay

## 2019-07-23 DIAGNOSIS — R319 Hematuria, unspecified: Secondary | ICD-10-CM | POA: Diagnosis not present

## 2019-07-23 DIAGNOSIS — N50812 Left testicular pain: Secondary | ICD-10-CM | POA: Insufficient documentation

## 2019-07-23 NOTE — ED Notes (Signed)
Pt changed into gown at this time.

## 2019-07-23 NOTE — ED Triage Notes (Addendum)
Pt c/o sudden onset pain to left testicle that started 2 hours PTA. Pain has been constant.

## 2019-07-24 ENCOUNTER — Emergency Department (HOSPITAL_COMMUNITY): Payer: 59

## 2019-07-24 LAB — URINALYSIS, ROUTINE W REFLEX MICROSCOPIC
Bacteria, UA: NONE SEEN
Bilirubin Urine: NEGATIVE
Glucose, UA: NEGATIVE mg/dL
Ketones, ur: NEGATIVE mg/dL
Leukocytes,Ua: NEGATIVE
Nitrite: NEGATIVE
Protein, ur: NEGATIVE mg/dL
Specific Gravity, Urine: 1.027 (ref 1.005–1.030)
pH: 6 (ref 5.0–8.0)

## 2019-07-24 LAB — URINE CULTURE: Culture: NO GROWTH

## 2019-07-24 MED ORDER — ACETAMINOPHEN 500 MG PO TABS
1000.0000 mg | ORAL_TABLET | Freq: Once | ORAL | Status: AC
  Administered 2019-07-24: 01:00:00 1000 mg via ORAL
  Filled 2019-07-24: qty 2

## 2019-07-24 NOTE — ED Notes (Signed)
ED Provider at bedside. 

## 2019-07-24 NOTE — ED Notes (Signed)
Pt transported to US

## 2019-07-24 NOTE — Discharge Instructions (Signed)
Follow-up with urology specialist earlier this week. Return for worsening testicular pain, persistent vomiting, fevers or new concerns.

## 2019-07-24 NOTE — ED Notes (Signed)
Pt returned from US

## 2019-07-24 NOTE — ED Provider Notes (Signed)
[facility] EMERGENCY DEPARTMENT Provider Note   CSN: [id number] Arrival date & time: 07/23/19  2338     History Chief Complaint  Patient presents with  . Testicle Pain    Shawn Mcknight is a 28 y.o. male.  Patient presents with acute onset left testicular pain and pressure started proximally 9:00 this evening.  No history of similar.  No injury.  No urinary symptoms or discharge.  No abdominal pain or vomiting.  Pain has improved since however persists.        Past Medical History:  Diagnosis Date  . Bradycardia   . Lactose intolerance     Patient Active Problem List   Diagnosis Date Noted  . Elevated LFTs 07/03/2019  . Ankle sprain 07/03/2019    Past Surgical History:  Procedure Laterality Date  . WISDOM TOOTH EXTRACTION         Family History  Problem Relation Age of Onset  . Hypertension Mother   . Healthy Father   . Thyroid disease Sister   . Thyroid cancer Sister     Social History   Tobacco Use  . Smoking status: Never Smoker  . Smokeless tobacco: Never Used  Substance Use Topics  . Alcohol use: No  . Drug use: Never    Home Medications Prior to Admission medications   Medication Sig Start Date End Date Taking? Authorizing Provider  omeprazole (PRILOSEC) 40 MG capsule Take 1 capsule (40 mg total) by mouth daily. 07/19/19 08/18/19 Yes [name], DO  simethicone (MYLICON) 0000000 MG chewable tablet Chew 1 tablet (125 mg total) by mouth every 6 (six) hours as needed for flatulence (bloating). 06/07/19  Yes Augusto Gamble B, NP    Allergies    Lactose intolerance (gi) and Shellfish allergy  Review of Systems   Review of Systems  Constitutional: Negative for chills and fever.  HENT: Negative for congestion.   Respiratory: Negative for shortness of breath.   Cardiovascular: Negative for chest pain.  Gastrointestinal: Negative for abdominal pain and vomiting.  Genitourinary: Positive for testicular pain. Negative for  dysuria and flank pain.  Musculoskeletal: Negative for back pain, neck pain and neck stiffness.  Skin: Negative for rash.  Neurological: Negative for light-headedness and headaches.    Physical Exam Updated Vital Signs BP 131/73 (BP Location: Left Arm)   Pulse 96   Temp 99.1 F (37.3 C) (Oral)   Resp 16   SpO2 96%   Physical Exam Vitals and nursing note reviewed.  Constitutional:      Appearance: He is well-developed.  HENT:     Head: Normocephalic and atraumatic.  Eyes:     General:        Right eye: No discharge.        Left eye: No discharge.     Conjunctiva/sclera: Conjunctivae normal.  Neck:     Trachea: No tracheal deviation.  Cardiovascular:     Rate and Rhythm: Normal rate.  Pulmonary:     Effort: Pulmonary effort is normal.  Abdominal:     General: There is no distension.     Palpations: Abdomen is soft.     Tenderness: There is no abdominal tenderness. There is no guarding.  Genitourinary:    Comments: Patient has no sign of hernia, no external sign of infection, mild tenderness to palpation of left testicle, normal position of both testicles. Musculoskeletal:     Cervical back: Normal range of motion and neck supple.  Skin:    General: Skin is  warm.     Findings: No rash.  Neurological:     Mental Status: He is alert and oriented to person, place, and time.     ED Results / Procedures / Treatments   Labs (all labs ordered are listed, but only abnormal results are displayed) Labs Reviewed  URINALYSIS, ROUTINE W REFLEX MICROSCOPIC - Abnormal; Notable for the following components:      Result Value   Hgb urine dipstick SMALL (*)    All other components within normal limits  URINE CULTURE    EKG None  Radiology US SCROTUM W/DOPPLER  Result Date: 07/24/2019 CLINICAL DATA:  Left testicular pain, sudden onset EXAM: SCROTAL ULTRASOUND DOPPLER ULTRASOUND OF THE TESTICLES TECHNIQUE: Complete ultrasound examination of the testicles, epididymis, and  other scrotal structures was performed. Color and spectral Doppler ultrasound were also utilized to evaluate blood flow to the testicles. COMPARISON:  None FINDINGS: Right testicle Measurements: 4.1 x 2.4 x 2.7 cm, volume 13.5 mL. No mass or microlithiasis visualized. Left testicle Measurements: 4.4 x 2.3 x 2.6 cm, volume 13.6 mL. No mass or microlithiasis visualized. Right epididymis:  Normal in size and appearance. Left epididymis:  Normal in size and appearance. Hydrocele:  None visualized. Varicocele:  None visualized. Pulsed Doppler interrogation of both testes demonstrates normal low resistance arterial and venous waveforms bilaterally. IMPRESSION: No evidence of testicular torsion, mass or other acute scrotal abnormality. Electronically Signed   By: Lovena Le M.D.   On: 07/24/2019 00:43    Procedures Procedures (including critical care time)  Medications Ordered in ED Medications  acetaminophen (TYLENOL) tablet 1,000 mg (1,000 mg Oral Given 07/24/19 0121)    ED Course  I have reviewed the triage vital signs and the nursing notes.  Pertinent labs & imaging results that were available during my care of the patient were reviewed by me and considered in my medical decision making (see chart for details).    MDM Rules/Calculators/A&P                      Patient presents with acute onset left testicular pain plan for ultrasound to look for signs of torsion or other pathology.  Pain control at this time.  Urinalysis pending.  Concern for possible epididymitis. Urinalysis shows minimal hemoglobin.  No sign of infection.  Ultrasound results reviewed showing normal blood flow, no mass or abscess, no significant inflammation.  Patient stable for close outpatient follow-up with urology.  Pain improved/resolved on reassessment.  Vital signs improved. Final Clinical Impression(s) / ED Diagnoses Final diagnoses:  Left testicular pain  Hematuria, unspecified type    Rx / DC Orders ED  Discharge Orders    None       Elnora Morrison, MD 07/24/19 [phone]

## 2019-07-24 NOTE — ED Notes (Signed)
Pt given water at this time 

## 2019-07-26 ENCOUNTER — Other Ambulatory Visit: Payer: 59

## 2019-08-04 ENCOUNTER — Other Ambulatory Visit: Payer: 59

## 2019-08-10 ENCOUNTER — Other Ambulatory Visit: Payer: Self-pay

## 2019-08-11 ENCOUNTER — Other Ambulatory Visit (INDEPENDENT_AMBULATORY_CARE_PROVIDER_SITE_OTHER): Payer: 59

## 2019-08-11 DIAGNOSIS — R7989 Other specified abnormal findings of blood chemistry: Secondary | ICD-10-CM

## 2019-08-14 ENCOUNTER — Other Ambulatory Visit: Payer: 59

## 2019-08-14 LAB — HEPATIC FUNCTION PANEL
AG Ratio: 1.8 (calc) (ref 1.0–2.5)
ALT: 306 U/L — ABNORMAL HIGH (ref 9–46)
AST: 103 U/L — ABNORMAL HIGH (ref 10–40)
Albumin: 5 g/dL (ref 3.6–5.1)
Alkaline phosphatase (APISO): 103 U/L (ref 36–130)
Bilirubin, Direct: 0.1 mg/dL (ref 0.0–0.2)
Globulin: 2.8 g/dL (calc) (ref 1.9–3.7)
Indirect Bilirubin: 0.4 mg/dL (calc) (ref 0.2–1.2)
Total Bilirubin: 0.5 mg/dL (ref 0.2–1.2)
Total Protein: 7.8 g/dL (ref 6.1–8.1)

## 2019-08-14 LAB — HEPATITIS PANEL, ACUTE
Hep A IgM: NONREACTIVE
Hep B C IgM: NONREACTIVE
Hepatitis B Surface Ag: NONREACTIVE
Hepatitis C Ab: NONREACTIVE
SIGNAL TO CUT-OFF: 0.01 (ref <1.00)

## 2019-08-18 ENCOUNTER — Other Ambulatory Visit: Payer: Self-pay | Admitting: Family Medicine

## 2019-08-18 ENCOUNTER — Encounter: Payer: Self-pay | Admitting: Family Medicine

## 2019-08-18 DIAGNOSIS — R7989 Other specified abnormal findings of blood chemistry: Secondary | ICD-10-CM

## 2019-08-21 ENCOUNTER — Encounter: Payer: Self-pay | Admitting: Gastroenterology

## 2019-09-01 ENCOUNTER — Other Ambulatory Visit: Payer: Self-pay

## 2019-09-01 ENCOUNTER — Encounter: Payer: Self-pay | Admitting: Gastroenterology

## 2019-09-01 ENCOUNTER — Ambulatory Visit: Payer: 59 | Admitting: Gastroenterology

## 2019-09-01 VITALS — BP 114/70 | HR 95 | Temp 97.8°F | Ht 65.0 in | Wt 246.2 lb

## 2019-09-01 DIAGNOSIS — K219 Gastro-esophageal reflux disease without esophagitis: Secondary | ICD-10-CM

## 2019-09-01 DIAGNOSIS — R1032 Left lower quadrant pain: Secondary | ICD-10-CM

## 2019-09-01 DIAGNOSIS — Z6841 Body Mass Index (BMI) 40.0 and over, adult: Secondary | ICD-10-CM

## 2019-09-01 DIAGNOSIS — R197 Diarrhea, unspecified: Secondary | ICD-10-CM

## 2019-09-01 DIAGNOSIS — R195 Other fecal abnormalities: Secondary | ICD-10-CM | POA: Diagnosis not present

## 2019-09-01 DIAGNOSIS — R7401 Elevation of levels of liver transaminase levels: Secondary | ICD-10-CM

## 2019-09-01 NOTE — Patient Instructions (Signed)
Low FODMAP Diet: (Fermentable Oligosaccharides, Disaccharides, Monosaccharides, and Polyols) These are short chain carbohydrates and sugar alcohols that are poorly absorbed by the body, resulting in multiple abdominal symptoms, including changes in bowel habits, abdominal pain/discomfort, bloating, abdominal distension, gas, etc.       Fiber Chart  You should be consuming 25-30g of fiber per day and drinking 8 glasses of water to help your bowels move regularly.  In the chart below you can look up how much fiber you are getting in an average day.  If you are not getting enough fiber, you should add a fiber supplement to your diet.  Examples of this include Metamucil, FiberCon and Citrucel.  These can be purchased at your local grocery store or pharmacy.      [email or web address]-guerrero.[email or web address]   Please start taking a daily fiber supplement such as citrucel, benefiber, metamucil, or fiber choice.   Your provider has requested that you go to the basement level for lab work at [facility] in [place] [address]. Press "B" on the elevator. The lab is located at the first door on the left as you exit the elevator.   It has been recommended to you by your physician that you have a(n) ENDO/COLONOSCOPY completed. Per your request, we did not schedule the procedure(s) today. Please contact our office at [phone] should you decide to have the procedure completed. You will be scheduled for a pre-visit and procedure at that time.  Return in 6 months  It was a pleasure to see you today!  Vito Cirigliano, D.O.

## 2019-09-01 NOTE — Progress Notes (Signed)
Chief Complaint: GERD, elevated liver enzymes, abdominal pain  Referring Provider:     [name], DO   HPI:     Shawn Mcknight is a 29 y.o. male with a hx of obesity (BMI 40.9) referred to the [facility] for evaluation of multiple GI issues, to include reflux symptoms, abdominal pain, elevated liver enzymes.  Reflux: Index sxs of HB, regurgitation. Worse at night and after greasy foods, overeating. Improved with decreased greasy foods. No dysphagia.  Present for the last year or so, with increasing frequency.  Started omeprazole 2-3 months ago (still taking) with overall improvement.  Abdominal pain: Intermittent LLQ pain with occasional RLQ pain. Has been present for a number of years. Can last for a few days at a time. +bloating and increased gas. Worse with stress, anxiety. Worse with fatty foods, spicy foods. Has avoided greasy foods lately with overall improvement. Will have alternating bowel habits between diarrhea and constipation. No hematochezia or melena. Near daily rectal irritation/itch.    He reports a history of "colitis" 5-8 years ago, diagnosed in [place]. Has no records for review. No colonoscopy, CT, etc- was a clinical dx. Reported "lactose intolerance" with bloating and increaed gas and diarrhea within 1 hour of eating. Drinks lactose-free milk, but still eats dairy foods though (cheese daily). No other dietary mods, high fiber, fiber supplement, etc.   Elevated liver enzymes: Incidentally noted elevation in ALT > AST with otherwise normal ALP, T bili, albumin in 05/2019.  No prior liver enzymes for comparison.  Trend as below.  Other than mother with fatty liver, no prior known personal or family history of liver disease, viral hepatitis and otherwise denies jaundice, icteric sclera, ascites, GI bleed, HE.  Does not drink any alcohol.  No prior blood transfusions.  -08/11/2019: AST/ALT 103/306; viral hepatitis panel negative -07/13/2019: RUQ  [place]: Increased echogenicity c/w hepatic steatosis, otherwise normal -07/03/2019: AST/ALT 91/267 -06/07/2019: AST/ALT 157/311; normal lipase and CBC   No previous EGD or colonoscopy.  No known family history of CRC, GI malignancy,pancreatic disease, or IBD.    Past Medical History:  Diagnosis Date  . Bradycardia   . IBS (irritable bowel syndrome)   . Lactose intolerance   . Obesity      Past Surgical History:  Procedure Laterality Date  . WISDOM TOOTH EXTRACTION     Family History  Problem Relation Age of Onset  . Hypertension Mother   . Liver disease Mother        fatty liver  . Healthy Father   . Thyroid disease Sister   . Thyroid cancer Sister   . Esophageal cancer Maternal Grandmother   . Stomach cancer Maternal Grandmother   . Colon cancer Maternal Grandmother        [name] they removed some of the colon. Not sure if it small or large. but thinks it is the small   Social History   Tobacco Use  . Smoking status: Never Smoker  . Smokeless tobacco: Never Used  Substance Use Topics  . Alcohol use: No  . Drug use: Never   Current Outpatient Medications  Medication Sig Dispense Refill  . omeprazole (PRILOSEC) 40 MG capsule Take 1 capsule (40 mg total) by mouth daily. 30 capsule 1  . simethicone (MYLICON) 0000000 MG chewable tablet Chew 1 tablet (125 mg total) by mouth every 6 (six) hours as needed for flatulence (bloating). 30 tablet 0   No current facility-administered  medications for this visit.   Allergies  Allergen Reactions  . Lactose Intolerance (Gi)   . Shellfish Allergy Swelling     Review of Systems: All systems reviewed and negative except where noted in HPI.     Physical Exam:    Wt Readings from Last 3 Encounters:  09/01/19 246 lb 4 oz (111.7 kg)  07/19/19 233 lb (105.7 kg)  07/03/19 239 lb (108.4 kg)    BP 114/70   Pulse 95   Temp 97.8 F (36.6 C)   Ht 5\' 5"  (1.651 m)   Wt 246 lb 4 oz (111.7 kg)   BMI 40.98 kg/m  Constitutional:   Pleasant, in no acute distress. Psychiatric: Normal mood and affect. Behavior is normal. EENT: Pupils normal.  Conjunctivae are normal. No scleral icterus. Neck supple. No cervical LAD. Cardiovascular: Normal rate, regular rhythm. No edema Pulmonary/chest: Effort normal and breath sounds normal. No wheezing, rales or rhonchi. Abdominal: Minimal discomfort in LLQ without rebound or guarding.  No peritoneal signs.  Soft, nondistended. Bowel sounds active throughout. There are no masses palpable. No hepatomegaly. Neurological: Alert and oriented to person place and time. Skin: Skin is warm and dry. No rashes noted. Rectal: Exam deferred by patient to time of colonoscopy.    ASSESSMENT AND PLAN;   1) Elevated ALT 2) Elevated AST 3) Hepatic steatosis noted on ultrasound Brodric Stach is a 29 y.o. male presenting to the Gastroenterology Clinic with elevated ALT>AST and a RUQ [place] that was significant for fatty liver infiltration. Given these findings, his body habitus (BMI 41) favor NAFLD as the underlying etiology for elevated LAEs. However, given degree of elevation and persistently elevated, will also elect to r/o other causes for chronic liver disease, to include Autoimmune hepatitis, Wilson Disease, Hemochromatosis, Viral hepatitis, A1AT deficiency, PSC, and PBC. Additionally, if the above extensive w/u is unrevealing, and labs not improving with therapy as outlined below, can consider a liver biopsy vs non-invasive modality (ie, Fibroscan or MR Elastography as available) to evaluate the stage and grade of disease and differentiate between NAFLD and NASH.   - Check INR  - AMA, ANA, ASMA, A1AT, Hepatitis A, B, C evaluation, ferritin, iron panel, ceruloplasmin, LKM Ab  - Evaluate for Celiac with tTG  - Immunoglobulin level  - Given the presumed diagnosis of fatty liver disease, I extensively counseled the patient on the importance of diet and exercise, with a modest weight loss of 10% of total  body weight, done slowly over weeks to months -Repeat liver enzymes in 3 months  4) GERD: -Has typical reflux symptoms with improvement with dietary modifications and trial of acid suppression therapy -Continue current medications -Counseled extensively on dietary modifications -Counseled on relationship between reflux and obesity -EGD to evaluate for erosive esophagitis, LES laxity, hiatal hernia  5) Change in bowel habits 6) Diarrhea 7) LLQ pain Clinical presentation seems most consistent with IBS-mixed type.  However, given duration of symptoms, bothersome nature, plan for extended evaluation as below: -Colonoscopy with random and directed biopsies to rule out IBD, MC, etc. -EGD with duodenal biopsies -Increase dietary fiber.  Provided with fiber chart today with detailed explanation -Start low FODMAP diet.  Provided with handout today with detailed explanation -Start fiber supplement -As above, counseled on need to make dietary modifications -Offered trial of Levsin, but declined in favor of dietary mods -Evaluate for hemorrhoids at time of colonoscopy  8) Obesity (BMI 40.98): -Discussed relationship of obesity with multiple GI symptoms and presumed fatty liver disease -  Discussed dietary modifications and plan of action as outlined above -Mostly sedentary lifestyle with prolonged time at computer for work.  Implement exercise routine with at least 30 minutes of exercise 3 days/week  The indications, risks, and benefits of EGD and colonoscopy were explained to the patient in detail. Risks include but are not limited to bleeding, perforation, adverse reaction to medications, and cardiopulmonary compromise. Sequelae include but are not limited to the possibility of surgery, hositalization, and mortality. The patient verbalized understanding and wished to proceed. All questions answered, referred to scheduler and bowel prep ordered. Further recommendations pending results of the exam.      Lavena Bullion, DO, FACG  09/01/2019, 9:41 AM   [name], DO

## 2019-09-04 ENCOUNTER — Encounter: Payer: Self-pay | Admitting: Gastroenterology

## 2019-09-06 ENCOUNTER — Other Ambulatory Visit (INDEPENDENT_AMBULATORY_CARE_PROVIDER_SITE_OTHER): Payer: 59

## 2019-09-06 DIAGNOSIS — K219 Gastro-esophageal reflux disease without esophagitis: Secondary | ICD-10-CM

## 2019-09-06 DIAGNOSIS — R195 Other fecal abnormalities: Secondary | ICD-10-CM | POA: Diagnosis not present

## 2019-09-06 DIAGNOSIS — R197 Diarrhea, unspecified: Secondary | ICD-10-CM

## 2019-09-06 DIAGNOSIS — R7401 Elevation of levels of liver transaminase levels: Secondary | ICD-10-CM | POA: Diagnosis not present

## 2019-09-06 DIAGNOSIS — R1032 Left lower quadrant pain: Secondary | ICD-10-CM | POA: Diagnosis not present

## 2019-09-06 LAB — PROTIME-INR
INR: 1.1 ratio — ABNORMAL HIGH (ref 0.8–1.0)
Prothrombin Time: 11.9 s (ref 9.6–13.1)

## 2019-09-06 LAB — IBC + FERRITIN
Ferritin: 457.4 ng/mL — ABNORMAL HIGH (ref 22.0–322.0)
Iron: 126 ug/dL (ref 42–165)
Saturation Ratios: 34.7 % (ref 20.0–50.0)
Transferrin: 259 mg/dL (ref 212.0–360.0)

## 2019-09-06 LAB — TSH: TSH: 2.19 u[IU]/mL (ref 0.35–4.50)

## 2019-09-06 LAB — IGA: IgA: 158 mg/dL (ref 68–378)

## 2019-09-10 LAB — HEPATITIS B CORE ANTIBODY, TOTAL: Hep B Core Total Ab: NONREACTIVE

## 2019-09-10 LAB — TISSUE TRANSGLUTAMINASE, IGA: (tTG) Ab, IgA: 1 U/mL

## 2019-09-10 LAB — CERULOPLASMIN: Ceruloplasmin: 35 mg/dL (ref 18–36)

## 2019-09-10 LAB — IGM: IgM, Serum: 90 mg/dL (ref 50–300)

## 2019-09-10 LAB — ANA: Anti Nuclear Antibody (ANA): NEGATIVE

## 2019-09-10 LAB — ALPHA-1-ANTITRYPSIN: A-1 Antitrypsin, Ser: 129 mg/dL (ref 83–199)

## 2019-09-10 LAB — IGG: IgG (Immunoglobin G), Serum: 1016 mg/dL (ref 600–1640)

## 2019-09-10 LAB — ANTI-SMOOTH MUSCLE ANTIBODY, IGG: Actin (Smooth Muscle) Antibody (IGG): <20 U (ref <20)

## 2019-09-10 LAB — MITOCHONDRIAL ANTIBODIES: Mitochondrial M2 Ab, IgG: <=20 U

## 2019-09-18 ENCOUNTER — Other Ambulatory Visit: Payer: Self-pay | Admitting: Gastroenterology

## 2019-09-18 ENCOUNTER — Other Ambulatory Visit: Payer: Self-pay

## 2019-09-18 ENCOUNTER — Ambulatory Visit (AMBULATORY_SURGERY_CENTER): Payer: Self-pay | Admitting: *Deleted

## 2019-09-18 VITALS — Temp 98.2°F | Ht 65.0 in | Wt 249.0 lb

## 2019-09-18 DIAGNOSIS — K219 Gastro-esophageal reflux disease without esophagitis: Secondary | ICD-10-CM

## 2019-09-18 DIAGNOSIS — R194 Change in bowel habit: Secondary | ICD-10-CM

## 2019-09-18 DIAGNOSIS — R7401 Elevation of levels of liver transaminase levels: Secondary | ICD-10-CM

## 2019-09-18 MED ORDER — CLENPIQ 10-3.5-12 MG-GM -GM/160ML PO SOLN: 1.0000 | ORAL | 0 refills | Status: DC

## 2019-09-18 NOTE — Progress Notes (Signed)
Patient is here in-person for PV. Patient denies any allergies to eggs or soy. Patient denies any problems with anesthesia/sedation. Patient denies any oxygen use at home. Patient denies taking any diet/weight loss medications or blood thinners. Patient is not being treated for MRSA or C-diff. EMMI education assisgned to the patient for the procedure, this was explained and instructions given to patient. COVID-19 screening test is on 3/1, the pt is aware. Pt is aware that care partner will wait in the car during procedure; if they feel like they will be too hot or cold to wait in the car; they may wait in the 4 th floor lobby. Patient is aware to bring only one care partner. We want them to wear a mask (we do not have any that we can provide them), practice social distancing, and we will check their temperatures when they get here.  I did remind the patient that their care partner needs to stay in the parking lot the entire time and have a cell phone available, we will call them when the pt is ready for discharge. Patient will wear mask into building.    Clenpiq coupon given to the patient.

## 2019-09-20 ENCOUNTER — Other Ambulatory Visit: Payer: Self-pay | Admitting: Family Medicine

## 2019-09-20 NOTE — Telephone Encounter (Signed)
Refill request for pending Rx patient of Dr. Zigmund Daniel states that he will be going to the office that Dr. Zigmund Daniel is currently located at but not able to schedule follow up right now. Last visit was 07/19/19. Would you refill until patient is able to see Dr. Zigmund Daniel. Please advise.

## 2019-09-25 ENCOUNTER — Other Ambulatory Visit: Payer: Self-pay | Admitting: Gastroenterology

## 2019-09-25 ENCOUNTER — Encounter: Payer: Self-pay | Admitting: Gastroenterology

## 2019-09-25 ENCOUNTER — Ambulatory Visit (INDEPENDENT_AMBULATORY_CARE_PROVIDER_SITE_OTHER): Payer: 59

## 2019-09-25 DIAGNOSIS — Z1159 Encounter for screening for other viral diseases: Secondary | ICD-10-CM

## 2019-09-25 LAB — SARS CORONAVIRUS 2 (TAT 6-24 HRS): SARS Coronavirus 2: NEGATIVE

## 2019-09-28 ENCOUNTER — Encounter: Payer: Self-pay | Admitting: Gastroenterology

## 2019-09-28 ENCOUNTER — Other Ambulatory Visit: Payer: Self-pay

## 2019-09-28 ENCOUNTER — Ambulatory Visit (AMBULATORY_SURGERY_CENTER): Payer: 59 | Admitting: Gastroenterology

## 2019-09-28 VITALS — BP 101/54 | HR 61 | Temp 96.9°F | Resp 15 | Ht 65.0 in | Wt 249.0 lb

## 2019-09-28 DIAGNOSIS — K635 Polyp of colon: Secondary | ICD-10-CM | POA: Diagnosis not present

## 2019-09-28 DIAGNOSIS — K640 First degree hemorrhoids: Secondary | ICD-10-CM | POA: Diagnosis not present

## 2019-09-28 DIAGNOSIS — R194 Change in bowel habit: Secondary | ICD-10-CM | POA: Diagnosis present

## 2019-09-28 DIAGNOSIS — K2970 Gastritis, unspecified, without bleeding: Secondary | ICD-10-CM | POA: Diagnosis not present

## 2019-09-28 DIAGNOSIS — K2100 Gastro-esophageal reflux disease with esophagitis, without bleeding: Secondary | ICD-10-CM | POA: Diagnosis not present

## 2019-09-28 DIAGNOSIS — K602 Anal fissure, unspecified: Secondary | ICD-10-CM

## 2019-09-28 DIAGNOSIS — D125 Benign neoplasm of sigmoid colon: Secondary | ICD-10-CM

## 2019-09-28 DIAGNOSIS — K2980 Duodenitis without bleeding: Secondary | ICD-10-CM

## 2019-09-28 DIAGNOSIS — K317 Polyp of stomach and duodenum: Secondary | ICD-10-CM

## 2019-09-28 DIAGNOSIS — K2950 Unspecified chronic gastritis without bleeding: Secondary | ICD-10-CM | POA: Diagnosis not present

## 2019-09-28 MED ORDER — OMEPRAZOLE 40 MG PO CPDR: 40.0000 mg | DELAYED_RELEASE_CAPSULE | Freq: Two times a day (BID) | ORAL | 3 refills | Status: DC

## 2019-09-28 MED ORDER — AMBULATORY NON FORMULARY MEDICATION: 0 refills | Status: DC

## 2019-09-28 MED ORDER — SODIUM CHLORIDE 0.9 % IV SOLN: 500.0000 mL | Freq: Once | INTRAVENOUS | Status: DC

## 2019-09-28 NOTE — Patient Instructions (Addendum)
HANDOUTS PROVIDED ON: ESOPHAGITIS, GASTRITIS, POLYPS, & HEMORRHOIDS  The polyps removed/biopsies taken today have been sent for pathology.  The results can take 1-3 weeks to receive.  When your next colonoscopy should occur will be based on the pathology results.    You may resume your previous diet and medication schedule.  A PRESCRIPTION FOR 40mg  PRILOSEC HAS BEEN SENT TO YOUR PHARMACY.  TAKE TWICE A DAY FOR 6 WEEKS THEN DECREASE TO ONCE A DAY. TAKE THE WRITTEN PRESCRIPTION FOR THE NITROGLYCERIN GEL TO BE FILLED ALSO.  Thank you for allowing [place] to care for you today!!!   YOU HAD AN ENDOSCOPIC PROCEDURE TODAY AT [place]:   Refer to the procedure report that was given to you for any specific questions about what was found during the examination.  If the procedure report does not answer your questions, please call your gastroenterologist to clarify.  If you requested that your care partner not be given the details of your procedure findings, then the procedure report has been included in a sealed envelope for you to review at your convenience later.  YOU SHOULD EXPECT: Some feelings of bloating in the abdomen. Passage of more gas than usual.  Walking can help get rid of the air that was put into your GI tract during the procedure and reduce the bloating. If you had a lower endoscopy (such as a colonoscopy or flexible sigmoidoscopy) you may notice spotting of blood in your stool or on the toilet paper. If you underwent a bowel prep for your procedure, you may not have a normal bowel movement for a few days.  Please Note:  You might notice some irritation and congestion in your nose or some drainage.  This is from the oxygen used during your procedure.  There is no need for concern and it should clear up in a day or so.  SYMPTOMS TO REPORT IMMEDIATELY:   Following lower endoscopy (colonoscopy or flexible sigmoidoscopy):  Excessive amounts of blood in the stool  Significant  tenderness or worsening of abdominal pains  Swelling of the abdomen that is new, acute  Fever of 100F or higher   Following upper endoscopy (EGD)  Vomiting of blood or coffee ground material  New chest pain or pain under the shoulder blades  Painful or persistently difficult swallowing  New shortness of breath  Fever of 100F or higher  Black, tarry-looking stools  For urgent or emergent issues, a gastroenterologist can be reached at any hour by calling [phone]. Do not use MyChart messaging for urgent concerns.    DIET:  We do recommend a small meal at first, but then you may proceed to your regular diet.  Drink plenty of fluids but you should avoid alcoholic beverages for 24 hours.  ACTIVITY:  You should plan to take it easy for the rest of today and you should NOT DRIVE or use heavy machinery until tomorrow (because of the sedation medicines used during the test).    FOLLOW UP: Our staff will call the number listed on your records 48-72 hours following your procedure to check on you and address any questions or concerns that you may have regarding the information given to you following your procedure. If we do not reach you, we will leave a message.  We will attempt to reach you two times.  During this call, we will ask if you have developed any symptoms of COVID 19. If you develop any symptoms (ie: fever, flu-like symptoms, shortness  of breath, cough etc.) before then, please call [phone].  If you test positive for Covid 19 in the 2 weeks post procedure, please call and report this information to [place].    If any biopsies were taken you will be contacted by phone or by letter within the next 1-3 weeks.  Please call us at [phone] if you have not heard about the biopsies in 3 weeks.    SIGNATURES/CONFIDENTIALITY: You and/or your care partner have signed paperwork which will be entered into your electronic medical record.  These signatures attest to the fact that that  the information above on your After Visit Summary has been reviewed and is understood.  Full responsibility of the confidentiality of this discharge information lies with you and/or your care-partner.

## 2019-09-28 NOTE — Progress Notes (Signed)
Called to room to assist during endoscopic procedure.  Patient ID and intended procedure confirmed with present staff. Received instructions for my participation in the procedure from the performing physician.  

## 2019-09-28 NOTE — Progress Notes (Signed)
To pacu, vss. Report to [email or web address] 

## 2019-09-28 NOTE — Op Note (Signed)
[facility] Patient Name: Shawn Mcknight Procedure Date: 09/28/2019 2:58 PM MRN: [id number] Endoscopist: Gerrit Heck , MD Age: 29 Referring MD:  Date of Birth: 1991/03/27 Gender: Male Account #: [id number] Procedure:                Colonoscopy Indications:              Abdominal pain in the left lower quadrant,                            Abdominal pain in the right lower quadrant, Diarrhea Medicines:                Monitored Anesthesia Care Procedure:                Pre-Anesthesia Assessment:                           - Prior to the procedure, a History and Physical                            was performed, and patient medications and                            allergies were reviewed. The patient's tolerance of                            previous anesthesia was also reviewed. The risks                            and benefits of the procedure and the sedation                            options and risks were discussed with the patient.                            All questions were answered, and informed consent                            was obtained. Prior Anticoagulants: The patient has                            taken no previous anticoagulant or antiplatelet                            agents. ASA Grade Assessment: II - A patient with                            mild systemic disease. After reviewing the risks                            and benefits, the patient was deemed in                            satisfactory condition to undergo the procedure.  After obtaining informed consent, the colonoscope                            was passed under direct vision. Throughout the                            procedure, the patient's blood pressure, pulse, and                            oxygen saturations were monitored continuously. The                            Colonoscope was introduced through the anus and                            advanced to the  the terminal ileum. The colonoscopy                            was performed without difficulty. The patient                            tolerated the procedure well. The quality of the                            bowel preparation was adequate. The terminal ileum,                            ileocecal valve, appendiceal orifice, and rectum                            were photographed. Scope In: 3:09:04 PM Scope Out: 3:22:23 PM Scope Withdrawal Time: 0 hours 11 minutes 45 seconds  Total Procedure Duration: 0 hours 13 minutes 19 seconds  Findings:                 An anal fissure was found on perianal exam.                           A 3 mm polyp was found in the sigmoid colon. The                            polyp was sessile. The polyp was removed with a                            cold biopsy forceps. Resection and retrieval were                            complete. Estimated blood loss was minimal.                           Normal mucosa was found in the remainder of the                            colon. Biopsies for histology  were taken with a                            cold forceps from the right colon and left colon                            for evaluation of microscopic colitis. Estimated                            blood loss was minimal.                           Non-bleeding internal hemorrhoids were found during                            retroflexion. The hemorrhoids were small.                           The terminal ileum appeared normal. Complications:            No immediate complications. Estimated Blood Loss:     Estimated blood loss was minimal. Impression:               - Anal fissure found on perianal exam.                           - One 3 mm polyp in the sigmoid colon, removed with                            a cold biopsy forceps. Resected and retrieved.                           - Normal mucosa in the entire examined colon.                            Biopsied.                            - Non-bleeding internal hemorrhoids.                           - The examined portion of the ileum was normal. Recommendation:           - Patient has a contact number available for                            emergencies. The signs and symptoms of potential                            delayed complications were discussed with the                            patient. Return to normal activities tomorrow.                            Written discharge instructions were provided to the  patient.                           - Resume previous diet.                           - Continue present medications.                           - Await pathology results.                           - Repeat colonoscopy for surveillance based on                            pathology results.                           - Use fiber, for example Citrucel, Fibercon, Konsyl                            or Metamucil.                           - Return to GI clinic PRN.                           - Start topical NTG 0.125%. Apply small pea-sized                            amount to affected area BID x6 weeks for treatment                            of anal fissure. Gerrit Heck, MD 09/28/2019 3:39:15 PM

## 2019-09-28 NOTE — Progress Notes (Signed)
Pt's states no medical or surgical changes since previsit or office visit.  TEMP-ADB  V/S-CW

## 2019-09-28 NOTE — Op Note (Signed)
[place] Patient Name: Shawn Mcknight Procedure Date: 09/28/2019 2:58 PM MRN: [id number] Endoscopist: Gerrit Heck , MD Age: 29 Referring MD:  Date of Birth: 02-Feb-1991 Gender: Male Account #: [id number] Procedure:                Upper GI endoscopy Indications:              Abdominal pain in the right lower quadrant,                            Abdominal pain in the left lower quadrant,                            Suspected esophageal reflux, Abdominal bloating,                            Diarrhea Medicines:                Monitored Anesthesia Care Procedure:                Pre-Anesthesia Assessment:                           - Prior to the procedure, a History and Physical                            was performed, and patient medications and                            allergies were reviewed. The patient's tolerance of                            previous anesthesia was also reviewed. The risks                            and benefits of the procedure and the sedation                            options and risks were discussed with the patient.                            All questions were answered, and informed consent                            was obtained. Prior Anticoagulants: The patient has                            taken no previous anticoagulant or antiplatelet                            agents. ASA Grade Assessment: II - A patient with                            mild systemic disease. After reviewing the risks  and benefits, the patient was deemed in                            satisfactory condition to undergo the procedure.                           After obtaining informed consent, the endoscope was                            passed under direct vision. Throughout the                            procedure, the patient's blood pressure, pulse, and                            oxygen saturations were monitored continuously. The                  Endoscope was introduced through the mouth, and                            advanced to the second part of duodenum. The upper                            GI endoscopy was accomplished without difficulty.                            The patient tolerated the procedure well. Scope In: Scope Out: Findings:                 LA Grade A (one or more mucosal breaks less than 5                            mm, not extending between tops of 2 mucosal folds)                            esophagitis with no bleeding was found 38 cm from                            the incisors.                           The upper third of the esophagus and middle third                            of the esophagus were normal.                           The gastroesophageal flap valve was visualized                            endoscopically and classified as Hill Grade II                            (fold present, opens with respiration).  Scattered mild inflammation characterized by                            erythema was found in the gastric body and in the                            gastric antrum. Biopsies were taken with a cold                            forceps for Helicobacter pylori testing. Estimated                            blood loss was minimal.                           A single 3 mm sessile polyp was found in the                            duodenal bulb. The polyp was removed with a cold                            biopsy forceps. Resection and retrieval were                            complete. Estimated blood loss was minimal.                           The second portion of the duodenum was normal. Complications:            No immediate complications. Estimated Blood Loss:     Estimated blood loss was minimal. Impression:               - LA Grade A reflux esophagitis with no bleeding.                           - Normal upper third of esophagus and middle third                             of esophagus.                           - Gastroesophageal flap valve classified as Hill                            Grade II (fold present, opens with respiration).                           - Gastritis. Biopsied.                           - A single duodenal polyp. Resected and retrieved.                           - Normal second portion of the duodenum. Recommendation:           -  Patient has a contact number available for                            emergencies. The signs and symptoms of potential                            delayed complications were discussed with the                            patient. Return to normal activities tomorrow.                            Written discharge instructions were provided to the                            patient.                           - Resume previous diet.                           - Continue present medications.                           - Await pathology results.                           - Increase Prilosec (omeprazole) to 40 mg PO BID                            for 6 weeks to promote mucosal healing and for                            treatment of reflux esophagitis, then reduce back                            to 40 mg/day and titrate to lowest effective dose                            to control reflux symptoms.                           - Colonoscopy today. Gerrit Heck, MD 09/28/2019 3:34:26 PM

## 2019-10-02 ENCOUNTER — Telehealth: Payer: Self-pay | Admitting: *Deleted

## 2019-10-02 NOTE — Telephone Encounter (Signed)
  Follow up Call-  Call back number 09/28/2019  Post procedure Call Back phone  # [phone]  Permission to leave phone message Yes     Patient questions:  Do you have a fever, pain , or abdominal swelling? No. Pain Score  0 *  Have you tolerated food without any problems? Yes.    Have you been able to return to your normal activities? Yes.    Do you have any questions about your discharge instructions: Diet   No. Medications  No. Follow up visit  No.  Do you have questions or concerns about your Care? No.  Actions: * If pain score is 4 or above: No action needed, pain <4  1. Have you developed a fever since your procedure? NO  2.   Have you had an respiratory symptoms (SOB or cough) since your procedure? NO  3.   Have you tested positive for COVID 19 since your procedure NO  4.   Have you had any family members/close contacts diagnosed with the COVID 19 since your procedure?  NO   If yes to any of these questions please route to Joylene John, RN and Alphonsa Gin, RN.

## 2019-10-04 ENCOUNTER — Encounter: Payer: Self-pay | Admitting: Gastroenterology

## 2019-10-19 ENCOUNTER — Ambulatory Visit: Payer: 59

## 2019-10-19 ENCOUNTER — Telehealth: Payer: Self-pay | Admitting: Family Medicine

## 2019-10-19 NOTE — Telephone Encounter (Signed)
Patient called and reports that he is having sore throat, sinus congestion, and cough. I advised him that per his PCP that he would need to postpone his covid vaccine and re-schedule. He is going to call around and get covid testing and stay in quarantine. No other questions.

## 2019-10-20 ENCOUNTER — Ambulatory Visit: Payer: 59 | Attending: Internal Medicine

## 2019-10-20 DIAGNOSIS — Z20822 Contact with and (suspected) exposure to covid-19: Secondary | ICD-10-CM

## 2019-10-21 LAB — NOVEL CORONAVIRUS, NAA: SARS-CoV-2, NAA: NOT DETECTED

## 2019-10-21 LAB — SARS-COV-2, NAA 2 DAY TAT

## 2019-10-25 ENCOUNTER — Other Ambulatory Visit: Payer: Self-pay

## 2019-10-25 ENCOUNTER — Encounter: Payer: Self-pay | Admitting: Family Medicine

## 2019-10-25 ENCOUNTER — Ambulatory Visit (INDEPENDENT_AMBULATORY_CARE_PROVIDER_SITE_OTHER): Payer: 59 | Admitting: Family Medicine

## 2019-10-25 VITALS — BP 130/91 | HR 59 | Temp 98.2°F | Ht 65.0 in | Wt 251.0 lb

## 2019-10-25 DIAGNOSIS — S00419A Abrasion of unspecified ear, initial encounter: Secondary | ICD-10-CM | POA: Insufficient documentation

## 2019-10-25 DIAGNOSIS — K760 Fatty (change of) liver, not elsewhere classified: Secondary | ICD-10-CM

## 2019-10-25 DIAGNOSIS — S00412A Abrasion of left ear, initial encounter: Secondary | ICD-10-CM | POA: Diagnosis not present

## 2019-10-25 DIAGNOSIS — S93402A Sprain of unspecified ligament of left ankle, initial encounter: Secondary | ICD-10-CM

## 2019-10-25 DIAGNOSIS — Z6841 Body Mass Index (BMI) 40.0 and over, adult: Secondary | ICD-10-CM | POA: Insufficient documentation

## 2019-10-25 NOTE — Assessment & Plan Note (Signed)
Discussed weight loss.  Referral to nutritionist to discuss dietary changes to help with weight loss.

## 2019-10-25 NOTE — Progress Notes (Signed)
Shawn Mcknight - 29 y.o. male MRN [id number]  Date of birth: [birth year]  Subjective Chief Complaint  Patient presents with  . Ear Injury    HPI Shawn Mcknight is a 29 y.o. male with history of anxiety, NAFLD, and obesity here today with complaint of ear problem.   -Ear problem:  Reports using Qtip this morning and noticing blood on qtip.  He has some post nasal drainage but  denies pain, drainage from ear or changes to hearing.    -Ankle pain;  Previous ankle sprain.  Still has pain from time to time. He denies swelling of the ankle.  He feels like his weight contributes to this as well.  He is interested in seeing a nutritionist to help with weight loss.   ROS:  A comprehensive ROS was completed and negative except as noted per HPI   Allergies  Allergen Reactions  . Shellfish Allergy Swelling  . Lactose Intolerance (Gi) Other (See Comments)    Bloating, gas    Past Medical History:  Diagnosis Date  . Bradycardia   . GERD (gastroesophageal reflux disease)   . IBS (irritable bowel syndrome)   . Lactose intolerance   . Obesity     Past Surgical History:  Procedure Laterality Date  . WISDOM TOOTH EXTRACTION      Social History   Socioeconomic History  . Marital status: Significant Other    Spouse name: Not on file  . Number of children: Not on file  . Years of education: Not on file  . Highest education level: Not on file  Occupational History  . Not on file  Tobacco Use  . Smoking status: Never Smoker  . Smokeless tobacco: Never Used  Substance and Sexual Activity  . Alcohol use: No  . Drug use: Never  . Sexual activity: Yes  Other Topics Concern  . Not on file  Social History Narrative  . Not on file   Social Determinants of Health   Financial Resource Strain:   . Difficulty of Paying Living Expenses:   Food Insecurity:   . Worried About Charity fundraiser in the Last Year:   . Arboriculturist in the Last Year:   Transportation Needs:   . Lexicographer (Medical):   Marland Kitchen Lack of Transportation (Non-Medical):   Physical Activity:   . Days of Exercise per Week:   . Minutes of Exercise per Session:   Stress:   . Feeling of Stress :   Social Connections:   . Frequency of Communication with Friends and Family:   . Frequency of Social Gatherings with Friends and Family:   . Attends Religious Services:   . Active Member of Clubs or Organizations:   . Attends Archivist Meetings:   Marland Kitchen Marital Status:     Family History  Problem Relation Age of Onset  . Hypertension Mother   . Liver disease Mother        fatty liver  . Healthy Father   . Thyroid disease Sister   . Thyroid cancer Sister   . Esophageal cancer Maternal Grandmother   . Stomach cancer Maternal Grandmother   . Colon cancer Maternal Grandmother        [name] they removed some of the colon. Not sure if it small or large. but thinks it is the small  . Colon polyps Neg Hx   . Rectal cancer Neg Hx     Health Maintenance  Topic Date Due  .  INFLUENZA VACCINE  10/25/2019 (Originally 02/25/2019)  . TETANUS/TDAP  10/24/2020 (Originally 08/01/2010)  . HIV Screening  Discontinued     ----------------------------------------------------------------------------------------------------------------------------------------------------------------------------------------------------------------- Physical Exam BP (!) 130/91   Pulse (!) 59   Temp 98.2 F (36.8 C) (Oral)   Ht 5\' 5"  (1.651 m)   Wt 251 lb (113.9 kg)   BMI 41.77 kg/m   Physical Exam Constitutional:      Appearance: Normal appearance.  HENT:     Head: Normocephalic and atraumatic.     Right Ear: Tympanic membrane normal.     Ears:     Comments: Small scabbed over area in L ear canal.    TM normal bilaterally.  Cardiovascular:     Rate and Rhythm: Normal rate and regular rhythm.  Pulmonary:     Effort: Pulmonary effort is normal.     Breath sounds: Normal breath sounds.  Musculoskeletal:      Cervical back: Neck supple.  Skin:    General: Skin is warm and dry.  Neurological:     General: No focal deficit present.     Mental Status: He is alert.  Psychiatric:        Mood and Affect: Mood normal.        Behavior: Behavior normal.     ------------------------------------------------------------------------------------------------------------------------------------------------------------------------------------------------------------------- Assessment and Plan  Ear canal abrasion Small abrasion, scabbed over at this time.  Advised to avoid Qtips.  If develops pain or drainage he is instructed to contact clinic.   NAFLD (nonalcoholic fatty liver disease) Discussed weight loss.  Referral to nutritionist to discuss dietary changes to help with weight loss.    Ankle sprain Still has some residual pain.  Discussed trying PT, he will let me know if he decides to try this.    No orders of the defined types were placed in this encounter.   No follow-ups on file.    This visit occurred during the SARS-CoV-2 public health emergency.  Safety protocols were in place, including screening questions prior to the visit, additional usage of staff PPE, and extensive cleaning of exam room while observing appropriate contact time as indicated for disinfecting solutions.

## 2019-10-25 NOTE — Patient Instructions (Addendum)
The ear should heal on its own.  Keep Qtips out of the ear.   You can use allergy medication (zyrtec, allegra) or sudafed for post nasal drainage.  You should be ok to have your COVID vaccine.   If you decide you want to do PT for the ankle let me know.   I have entered a referral for a nutritionist

## 2019-10-25 NOTE — Assessment & Plan Note (Signed)
Small abrasion, scabbed over at this time.  Advised to avoid Qtips.  If develops pain or drainage he is instructed to contact clinic.

## 2019-10-25 NOTE — Assessment & Plan Note (Signed)
Still has some residual pain.  Discussed trying PT, he will let me know if he decides to try this.

## 2019-10-28 ENCOUNTER — Ambulatory Visit: Payer: 59 | Attending: Internal Medicine

## 2019-10-28 DIAGNOSIS — Z23 Encounter for immunization: Secondary | ICD-10-CM

## 2019-10-28 NOTE — Progress Notes (Signed)
   Covid-19 Vaccination Clinic  Name:  Shawn Mcknight    MRN: [id number] DOB: 03-14-1991  10/28/2019  [name] was observed post Covid-19 immunization for 15 minutes without incident. He was provided with Vaccine Information Sheet and instruction to access the V-Safe system.   [name] was instructed to call 911 with any severe reactions post vaccine: Marland Kitchen Difficulty breathing  . Swelling of face and throat  . A fast heartbeat  . A bad rash all over body  . Dizziness and weakness   Immunizations Administered    Name Date Dose VIS Date Route   Pfizer COVID-19 Vaccine 10/28/2019  8:15 AM 0.3 mL 07/07/2019 Intramuscular   Manufacturer: [employer]   Lot: [id number]   [place]: [id number]

## 2019-11-21 ENCOUNTER — Ambulatory Visit: Payer: 59 | Attending: Internal Medicine

## 2019-11-21 DIAGNOSIS — Z23 Encounter for immunization: Secondary | ICD-10-CM

## 2019-11-21 NOTE — Progress Notes (Signed)
   Covid-19 Vaccination Clinic  Name:  Halton Soliman    MRN: [id number] DOB: 17-Aug-1990  11/21/2019  [name] was observed post Covid-19 immunization for 30 minutes based on pre-vaccination screening without incident. He was provided with Vaccine Information Sheet and instruction to access the V-Safe system.   [name] was instructed to call 911 with any severe reactions post vaccine: Marland Kitchen Difficulty breathing  . Swelling of face and throat  . A fast heartbeat  . A bad rash all over body  . Dizziness and weakness   Immunizations Administered    Name Date Dose VIS Date Route   Pfizer COVID-19 Vaccine 11/21/2019  8:48 AM 0.3 mL 09/20/2018 Intramuscular   Manufacturer: [place]   Lot: [id number]   [place]: [id number]

## 2019-11-25 ENCOUNTER — Ambulatory Visit (INDEPENDENT_AMBULATORY_CARE_PROVIDER_SITE_OTHER): Payer: 59

## 2019-11-25 ENCOUNTER — Inpatient Hospital Stay (HOSPITAL_COMMUNITY)
Admission: EM | Admit: 2019-11-25 | Discharge: 2019-11-28 | DRG: 488 | Disposition: A | Discharge status: Home / Self Care | Payer: 59 | Source: Ambulatory Visit | Attending: Orthopedic Surgery | Admitting: Orthopedic Surgery

## 2019-11-25 ENCOUNTER — Encounter (HOSPITAL_COMMUNITY): Payer: Self-pay

## 2019-11-25 ENCOUNTER — Other Ambulatory Visit: Payer: Self-pay

## 2019-11-25 ENCOUNTER — Emergency Department (HOSPITAL_COMMUNITY): Payer: 59

## 2019-11-25 ENCOUNTER — Ambulatory Visit (HOSPITAL_COMMUNITY)
Admission: EM | Admit: 2019-11-25 | Discharge: 2019-11-25 | Disposition: A | Discharge status: Home / Self Care | Payer: 59 | Source: Home / Self Care

## 2019-11-25 DIAGNOSIS — S82102A Unspecified fracture of upper end of left tibia, initial encounter for closed fracture: Secondary | ICD-10-CM | POA: Diagnosis not present

## 2019-11-25 DIAGNOSIS — M25562 Pain in left knee: Secondary | ICD-10-CM | POA: Diagnosis not present

## 2019-11-25 DIAGNOSIS — Z8249 Family history of ischemic heart disease and other diseases of the circulatory system: Secondary | ICD-10-CM

## 2019-11-25 DIAGNOSIS — Z79899 Other long term (current) drug therapy: Secondary | ICD-10-CM

## 2019-11-25 DIAGNOSIS — K589 Irritable bowel syndrome without diarrhea: Secondary | ICD-10-CM | POA: Diagnosis present

## 2019-11-25 DIAGNOSIS — S40212A Abrasion of left shoulder, initial encounter: Secondary | ICD-10-CM | POA: Diagnosis present

## 2019-11-25 DIAGNOSIS — Z808 Family history of malignant neoplasm of other organs or systems: Secondary | ICD-10-CM

## 2019-11-25 DIAGNOSIS — M79605 Pain in left leg: Secondary | ICD-10-CM

## 2019-11-25 DIAGNOSIS — Z20822 Contact with and (suspected) exposure to covid-19: Secondary | ICD-10-CM | POA: Diagnosis present

## 2019-11-25 DIAGNOSIS — M25512 Pain in left shoulder: Secondary | ICD-10-CM

## 2019-11-25 DIAGNOSIS — S83282A Other tear of lateral meniscus, current injury, left knee, initial encounter: Secondary | ICD-10-CM | POA: Diagnosis present

## 2019-11-25 DIAGNOSIS — Z6841 Body Mass Index (BMI) 40.0 and over, adult: Secondary | ICD-10-CM

## 2019-11-25 DIAGNOSIS — Z7982 Long term (current) use of aspirin: Secondary | ICD-10-CM | POA: Diagnosis not present

## 2019-11-25 DIAGNOSIS — K219 Gastro-esophageal reflux disease without esophagitis: Secondary | ICD-10-CM | POA: Diagnosis present

## 2019-11-25 DIAGNOSIS — Y9355 Activity, bike riding: Secondary | ICD-10-CM

## 2019-11-25 DIAGNOSIS — Z419 Encounter for procedure for purposes other than remedying health state, unspecified: Secondary | ICD-10-CM

## 2019-11-25 DIAGNOSIS — K760 Fatty (change of) liver, not elsewhere classified: Secondary | ICD-10-CM | POA: Diagnosis present

## 2019-11-25 DIAGNOSIS — M79662 Pain in left lower leg: Secondary | ICD-10-CM | POA: Diagnosis present

## 2019-11-25 DIAGNOSIS — Z8379 Family history of other diseases of the digestive system: Secondary | ICD-10-CM | POA: Diagnosis not present

## 2019-11-25 DIAGNOSIS — Z23 Encounter for immunization: Secondary | ICD-10-CM | POA: Diagnosis not present

## 2019-11-25 DIAGNOSIS — S82122A Displaced fracture of lateral condyle of left tibia, initial encounter for closed fracture: Secondary | ICD-10-CM | POA: Diagnosis present

## 2019-11-25 DIAGNOSIS — Z800 Family history of malignant neoplasm of digestive organs: Secondary | ICD-10-CM

## 2019-11-25 DIAGNOSIS — S82142A Displaced bicondylar fracture of left tibia, initial encounter for closed fracture: Secondary | ICD-10-CM | POA: Diagnosis present

## 2019-11-25 DIAGNOSIS — Z8349 Family history of other endocrine, nutritional and metabolic diseases: Secondary | ICD-10-CM | POA: Diagnosis not present

## 2019-11-25 DIAGNOSIS — Y92410 Unspecified street and highway as the place of occurrence of the external cause: Secondary | ICD-10-CM

## 2019-11-25 HISTORY — DX: Displaced fracture of lateral condyle of left tibia, initial encounter for closed fracture: S82.122A

## 2019-11-25 LAB — CBC WITH DIFFERENTIAL/PLATELET
Abs Immature Granulocytes: 0.08 10*3/uL — ABNORMAL HIGH (ref 0.00–0.07)
Basophils Absolute: 0 10*3/uL (ref 0.0–0.1)
Basophils Relative: 0 %
Eosinophils Absolute: 0 10*3/uL (ref 0.0–0.5)
Eosinophils Relative: 0 %
HCT: 48.8 % (ref 39.0–52.0)
Hemoglobin: 16.1 g/dL (ref 13.0–17.0)
Immature Granulocytes: 1 %
Lymphocytes Relative: 9 %
Lymphs Abs: 1.3 10*3/uL (ref 0.7–4.0)
MCH: 28.9 pg (ref 26.0–34.0)
MCHC: 33 g/dL (ref 30.0–36.0)
MCV: 87.6 fL (ref 80.0–100.0)
Monocytes Absolute: 0.8 10*3/uL (ref 0.1–1.0)
Monocytes Relative: 6 %
Neutro Abs: 12.4 10*3/uL — ABNORMAL HIGH (ref 1.7–7.7)
Neutrophils Relative %: 84 %
Platelets: 279 10*3/uL (ref 150–400)
RBC: 5.57 MIL/uL (ref 4.22–5.81)
RDW: 12.8 % (ref 11.5–15.5)
WBC: 14.6 10*3/uL — ABNORMAL HIGH (ref 4.0–10.5)
nRBC: 0 % (ref 0.0–0.2)

## 2019-11-25 LAB — RESPIRATORY PANEL BY RT PCR (FLU A&B, COVID)
Influenza A by PCR: NEGATIVE
Influenza B by PCR: NEGATIVE
SARS Coronavirus 2 by RT PCR: NEGATIVE

## 2019-11-25 LAB — BASIC METABOLIC PANEL
Anion gap: 14 (ref 5–15)
BUN: 15 mg/dL (ref 6–20)
CO2: 21 mmol/L — ABNORMAL LOW (ref 22–32)
Calcium: 9.7 mg/dL (ref 8.9–10.3)
Chloride: 104 mmol/L (ref 98–111)
Creatinine, Ser: 0.96 mg/dL (ref 0.61–1.24)
GFR calc Af Amer: >60 mL/min (ref >60)
GFR calc non Af Amer: >60 mL/min (ref >60)
Glucose, Bld: 103 mg/dL — ABNORMAL HIGH (ref 70–99)
Potassium: 4.4 mmol/L (ref 3.5–5.1)
Sodium: 139 mmol/L (ref 135–145)

## 2019-11-25 LAB — POC SARS CORONAVIRUS 2 AG -  ED: SARS Coronavirus 2 Ag: NEGATIVE

## 2019-11-25 MED ORDER — ZOLPIDEM TARTRATE 5 MG PO TABS: 5.0000 mg | ORAL_TABLET | Freq: Every evening | ORAL | Status: DC | PRN

## 2019-11-25 MED ORDER — FLEET ENEMA 7-19 GM/118ML RE ENEM: 1.0000 | ENEMA | Freq: Once | RECTAL | Status: DC | PRN

## 2019-11-25 MED ORDER — POLYVINYL ALCOHOL 1.4 % OP SOLN
1.0000 [drp] | Freq: Every day | OPHTHALMIC | Status: DC
  Filled 2019-11-25: qty 15

## 2019-11-25 MED ORDER — SENNA 8.6 MG PO TABS
1.0000 | ORAL_TABLET | Freq: Two times a day (BID) | ORAL | Status: DC
  Administered 2019-11-25: 8.6 mg via ORAL
  Filled 2019-11-25: qty 1

## 2019-11-25 MED ORDER — POVIDONE-IODINE 10 % EX SWAB: 2.0000 "application " | Freq: Once | CUTANEOUS | Status: DC

## 2019-11-25 MED ORDER — MORPHINE SULFATE (PF) 2 MG/ML IV SOLN
0.5000 mg | INTRAVENOUS | Status: DC | PRN
  Filled 2019-11-25: qty 1

## 2019-11-25 MED ORDER — HYDROCODONE-ACETAMINOPHEN 5-325 MG PO TABS
1.0000 | ORAL_TABLET | ORAL | Status: DC | PRN
  Administered 2019-11-25: 1 via ORAL
  Filled 2019-11-25: qty 1

## 2019-11-25 MED ORDER — CHLORHEXIDINE GLUCONATE 4 % EX LIQD
60.0000 mL | Freq: Once | CUTANEOUS | Status: AC
  Administered 2019-11-25: 4 via TOPICAL
  Filled 2019-11-25: qty 60

## 2019-11-25 MED ORDER — ACETAMINOPHEN 500 MG PO TABS
500.0000 mg | ORAL_TABLET | Freq: Four times a day (QID) | ORAL | Status: DC
  Administered 2019-11-25 – 2019-11-26 (×2): 500 mg via ORAL
  Filled 2019-11-25 (×2): qty 1

## 2019-11-25 MED ORDER — KETOROLAC TROMETHAMINE 60 MG/2ML IM SOLN
INTRAMUSCULAR | Status: AC
  Filled 2019-11-25: qty 2

## 2019-11-25 MED ORDER — POTASSIUM CHLORIDE IN NACL 20-0.9 MEQ/L-% IV SOLN
INTRAVENOUS | Status: DC
  Filled 2019-11-25: qty 1000

## 2019-11-25 MED ORDER — TETANUS-DIPHTH-ACELL PERTUSSIS 5-2.5-18.5 LF-MCG/0.5 IM SUSP
0.5000 mL | Freq: Once | INTRAMUSCULAR | Status: AC
  Administered 2019-11-25: 0.5 mL via INTRAMUSCULAR
  Filled 2019-11-25: qty 0.5

## 2019-11-25 MED ORDER — SIMETHICONE 80 MG PO CHEW: 80.0000 mg | CHEWABLE_TABLET | Freq: Four times a day (QID) | ORAL | Status: DC | PRN

## 2019-11-25 MED ORDER — HYDROCODONE-ACETAMINOPHEN 7.5-325 MG PO TABS: 1.0000 | ORAL_TABLET | ORAL | Status: DC | PRN

## 2019-11-25 MED ORDER — KETOROLAC TROMETHAMINE 60 MG/2ML IM SOLN
60.0000 mg | Freq: Once | INTRAMUSCULAR | Status: AC
  Administered 2019-11-25: 60 mg via INTRAMUSCULAR

## 2019-11-25 MED ORDER — CEFAZOLIN SODIUM-DEXTROSE 2-4 GM/100ML-% IV SOLN
2.0000 g | INTRAVENOUS | Status: DC
  Filled 2019-11-25: qty 100

## 2019-11-25 MED ORDER — ENSURE PRE-SURGERY PO LIQD
296.0000 mL | Freq: Once | ORAL | Status: AC
  Administered 2019-11-26: 296 mL via ORAL
  Filled 2019-11-25: qty 296

## 2019-11-25 MED ORDER — ONDANSETRON HCL 4 MG/2ML IJ SOLN: 4.0000 mg | Freq: Four times a day (QID) | INTRAMUSCULAR | Status: DC | PRN

## 2019-11-25 MED ORDER — METHOCARBAMOL 500 MG PO TABS: 500.0000 mg | ORAL_TABLET | Freq: Four times a day (QID) | ORAL | Status: DC | PRN

## 2019-11-25 MED ORDER — PANTOPRAZOLE SODIUM 40 MG PO TBEC: 40.0000 mg | DELAYED_RELEASE_TABLET | Freq: Every day | ORAL | Status: DC

## 2019-11-25 MED ORDER — CALCIUM POLYCARBOPHIL 625 MG PO TABS
ORAL_TABLET | Freq: Three times a day (TID) | ORAL | Status: DC
  Filled 2019-11-25 (×2): qty 1

## 2019-11-25 MED ORDER — BISACODYL 10 MG RE SUPP: 10.0000 mg | Freq: Every day | RECTAL | Status: DC | PRN

## 2019-11-25 MED ORDER — ACETAMINOPHEN 325 MG PO TABS: 325.0000 mg | ORAL_TABLET | Freq: Four times a day (QID) | ORAL | Status: DC | PRN

## 2019-11-25 MED ORDER — METHOCARBAMOL 1000 MG/10ML IJ SOLN
500.0000 mg | Freq: Four times a day (QID) | INTRAVENOUS | Status: DC | PRN
  Filled 2019-11-25: qty 5

## 2019-11-25 MED ORDER — DOCUSATE SODIUM 100 MG PO CAPS
100.0000 mg | ORAL_CAPSULE | Freq: Two times a day (BID) | ORAL | Status: DC
  Administered 2019-11-25: 100 mg via ORAL
  Filled 2019-11-25: qty 1

## 2019-11-25 MED ORDER — DIPHENHYDRAMINE HCL 12.5 MG/5ML PO ELIX: 12.5000 mg | ORAL_SOLUTION | ORAL | Status: DC | PRN

## 2019-11-25 MED ORDER — ONDANSETRON HCL 4 MG PO TABS: 4.0000 mg | ORAL_TABLET | Freq: Four times a day (QID) | ORAL | Status: DC | PRN

## 2019-11-25 MED ORDER — SENNOSIDES-DOCUSATE SODIUM 8.6-50 MG PO TABS: 1.0000 | ORAL_TABLET | Freq: Every evening | ORAL | Status: DC | PRN
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
## 2019-11-25 NOTE — ED Notes (Signed)
Attempted to call ortho tech, no answer.
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
## 2019-11-25 NOTE — Discharge Instructions (Signed)
IMPRESSION: 1. Acute displaced fracture of the lateral tibial plateau. 2. Large joint effusion with evidence for lipohemarthrosis. 3. Soft tissue swelling about the knee.  ER for further treatment.
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
## 2019-11-25 NOTE — ED Notes (Signed)
Ortho called for splint application.
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
## 2019-11-26 ENCOUNTER — Encounter (HOSPITAL_COMMUNITY): Payer: Self-pay | Admitting: Orthopedic Surgery

## 2019-11-26 ENCOUNTER — Encounter (HOSPITAL_COMMUNITY)
Admission: EM | Disposition: A | Discharge status: Home / Self Care | Payer: Self-pay | Source: Home / Self Care | Attending: Orthopedic Surgery

## 2019-11-26 ENCOUNTER — Inpatient Hospital Stay (HOSPITAL_COMMUNITY): Payer: 59

## 2019-11-26 ENCOUNTER — Inpatient Hospital Stay (HOSPITAL_COMMUNITY): Payer: 59 | Admitting: Certified Registered Nurse Anesthetist

## 2019-11-26 HISTORY — PX: ORIF TIBIA PLATEAU: SHX2132

## 2019-11-26 LAB — CBC
HCT: 45.2 % (ref 39.0–52.0)
Hemoglobin: 14.8 g/dL (ref 13.0–17.0)
MCH: 28.7 pg (ref 26.0–34.0)
MCHC: 32.7 g/dL (ref 30.0–36.0)
MCV: 87.8 fL (ref 80.0–100.0)
Platelets: 242 10*3/uL (ref 150–400)
RBC: 5.15 MIL/uL (ref 4.22–5.81)
RDW: 13 % (ref 11.5–15.5)
WBC: 10 10*3/uL (ref 4.0–10.5)
nRBC: 0 % (ref 0.0–0.2)

## 2019-11-26 LAB — CREATININE, SERUM
Creatinine, Ser: 1.08 mg/dL (ref 0.61–1.24)
GFR calc Af Amer: >60 mL/min (ref >60)
GFR calc non Af Amer: >60 mL/min (ref >60)

## 2019-11-26 LAB — SURGICAL PCR SCREEN
MRSA, PCR: NEGATIVE
Staphylococcus aureus: NEGATIVE

## 2019-11-26 LAB — HIV ANTIBODY (ROUTINE TESTING W REFLEX): HIV Screen 4th Generation wRfx: NONREACTIVE

## 2019-11-26 SURGERY — OPEN REDUCTION INTERNAL FIXATION (ORIF) TIBIAL PLATEAU
Anesthesia: General | Site: Leg Upper | Laterality: Left

## 2019-11-26 MED ORDER — ASPIRIN EC 325 MG PO TBEC
325.0000 mg | DELAYED_RELEASE_TABLET | Freq: Two times a day (BID) | ORAL | 0 refills | Status: DC
Start: 2019-11-26 — End: 2020-11-13

## 2019-11-26 MED ORDER — LIDOCAINE 2% (20 MG/ML) 5 ML SYRINGE
INTRAMUSCULAR | Status: DC | PRN
  Administered 2019-11-26: 100 mg via INTRAVENOUS

## 2019-11-26 MED ORDER — KETOROLAC TROMETHAMINE 15 MG/ML IJ SOLN
7.5000 mg | Freq: Four times a day (QID) | INTRAMUSCULAR | Status: AC
  Administered 2019-11-26 – 2019-11-27 (×4): 7.5 mg via INTRAVENOUS
  Filled 2019-11-26 (×5): qty 1

## 2019-11-26 MED ORDER — METHOCARBAMOL 500 MG PO TABS
500.0000 mg | ORAL_TABLET | Freq: Four times a day (QID) | ORAL | Status: DC | PRN
  Administered 2019-11-26 – 2019-11-27 (×4): 500 mg via ORAL
  Filled 2019-11-26 (×5): qty 1

## 2019-11-26 MED ORDER — PROPOFOL 10 MG/ML IV BOLUS
INTRAVENOUS | Status: AC
  Filled 2019-11-26: qty 40

## 2019-11-26 MED ORDER — MIDAZOLAM HCL 2 MG/2ML IJ SOLN
INTRAMUSCULAR | Status: DC | PRN
  Administered 2019-11-26: 2 mg via INTRAVENOUS

## 2019-11-26 MED ORDER — DEXAMETHASONE SODIUM PHOSPHATE 10 MG/ML IJ SOLN
INTRAMUSCULAR | Status: DC | PRN
  Administered 2019-11-26: 10 mg via INTRAVENOUS

## 2019-11-26 MED ORDER — FENTANYL CITRATE (PF) 250 MCG/5ML IJ SOLN
INTRAMUSCULAR | Status: AC
  Filled 2019-11-26: qty 5

## 2019-11-26 MED ORDER — ONDANSETRON HCL 4 MG PO TABS: 4.0000 mg | ORAL_TABLET | Freq: Four times a day (QID) | ORAL | Status: DC | PRN

## 2019-11-26 MED ORDER — ONDANSETRON HCL 4 MG/2ML IJ SOLN
INTRAMUSCULAR | Status: DC | PRN
  Administered 2019-11-26: 4 mg via INTRAVENOUS

## 2019-11-26 MED ORDER — ENOXAPARIN SODIUM 40 MG/0.4ML ~~LOC~~ SOLN
40.0000 mg | SUBCUTANEOUS | Status: DC
  Administered 2019-11-27 – 2019-11-28 (×2): 40 mg via SUBCUTANEOUS
  Filled 2019-11-26 (×2): qty 0.4

## 2019-11-26 MED ORDER — ACETAMINOPHEN 325 MG PO TABS
325.0000 mg | ORAL_TABLET | Freq: Four times a day (QID) | ORAL | Status: DC | PRN
  Filled 2019-11-26: qty 2

## 2019-11-26 MED ORDER — HYDROCODONE-ACETAMINOPHEN 10-325 MG PO TABS: 1.0000 | ORAL_TABLET | Freq: Four times a day (QID) | ORAL | 0 refills | Status: DC | PRN

## 2019-11-26 MED ORDER — CEFAZOLIN SODIUM-DEXTROSE 2-4 GM/100ML-% IV SOLN
INTRAVENOUS | Status: AC
  Filled 2019-11-26: qty 100

## 2019-11-26 MED ORDER — BACLOFEN 10 MG PO TABS
10.0000 mg | ORAL_TABLET | Freq: Three times a day (TID) | ORAL | 0 refills | Status: DC
Start: 2019-11-26 — End: 2020-11-13

## 2019-11-26 MED ORDER — ZOLPIDEM TARTRATE 5 MG PO TABS: 5.0000 mg | ORAL_TABLET | Freq: Every evening | ORAL | Status: DC | PRN

## 2019-11-26 MED ORDER — MORPHINE SULFATE (PF) 2 MG/ML IV SOLN: 0.5000 mg | INTRAVENOUS | Status: DC | PRN

## 2019-11-26 MED ORDER — PROPOFOL 10 MG/ML IV BOLUS
INTRAVENOUS | Status: AC
  Filled 2019-11-26: qty 20

## 2019-11-26 MED ORDER — SUGAMMADEX SODIUM 200 MG/2ML IV SOLN
INTRAVENOUS | Status: DC | PRN
  Administered 2019-11-26: 250 mg via INTRAVENOUS

## 2019-11-26 MED ORDER — FENTANYL CITRATE (PF) 250 MCG/5ML IJ SOLN
INTRAMUSCULAR | Status: DC | PRN
  Administered 2019-11-26: 50 ug via INTRAVENOUS
  Administered 2019-11-26 (×3): 25 ug via INTRAVENOUS
  Administered 2019-11-26: 100 ug via INTRAVENOUS
  Administered 2019-11-26: 25 ug via INTRAVENOUS
  Administered 2019-11-26: 50 ug via INTRAVENOUS

## 2019-11-26 MED ORDER — LACTATED RINGERS IV SOLN: INTRAVENOUS | Status: DC

## 2019-11-26 MED ORDER — BISACODYL 10 MG RE SUPP: 10.0000 mg | Freq: Every day | RECTAL | Status: DC | PRN

## 2019-11-26 MED ORDER — POTASSIUM CHLORIDE IN NACL 20-0.45 MEQ/L-% IV SOLN
INTRAVENOUS | Status: DC
  Filled 2019-11-26 (×3): qty 1000

## 2019-11-26 MED ORDER — DIPHENHYDRAMINE HCL 12.5 MG/5ML PO ELIX: 12.5000 mg | ORAL_SOLUTION | ORAL | Status: DC | PRN

## 2019-11-26 MED ORDER — POLYETHYLENE GLYCOL 3350 17 G PO PACK: 17.0000 g | PACK | Freq: Every day | ORAL | Status: DC | PRN

## 2019-11-26 MED ORDER — DOCUSATE SODIUM 100 MG PO CAPS
100.0000 mg | ORAL_CAPSULE | Freq: Two times a day (BID) | ORAL | Status: DC
  Administered 2019-11-26 – 2019-11-28 (×4): 100 mg via ORAL
  Filled 2019-11-26 (×5): qty 1

## 2019-11-26 MED ORDER — CEFAZOLIN SODIUM-DEXTROSE 2-3 GM-%(50ML) IV SOLR
INTRAVENOUS | Status: DC | PRN
  Administered 2019-11-26: 2 g via INTRAVENOUS

## 2019-11-26 MED ORDER — PROPOFOL 10 MG/ML IV BOLUS
INTRAVENOUS | Status: DC | PRN
  Administered 2019-11-26: 250 mg via INTRAVENOUS

## 2019-11-26 MED ORDER — ROCURONIUM BROMIDE 10 MG/ML (PF) SYRINGE
PREFILLED_SYRINGE | INTRAVENOUS | Status: DC | PRN
  Administered 2019-11-26: 60 mg via INTRAVENOUS

## 2019-11-26 MED ORDER — HYDROCODONE-ACETAMINOPHEN 7.5-325 MG PO TABS
1.0000 | ORAL_TABLET | ORAL | Status: DC | PRN
  Administered 2019-11-28: 2 via ORAL
  Filled 2019-11-26: qty 2

## 2019-11-26 MED ORDER — MAGNESIUM CITRATE PO SOLN: 1.0000 | Freq: Once | ORAL | Status: DC | PRN

## 2019-11-26 MED ORDER — SENNA-DOCUSATE SODIUM 8.6-50 MG PO TABS
2.0000 | ORAL_TABLET | Freq: Every day | ORAL | 1 refills | Status: DC
Start: 2019-11-26 — End: 2020-11-13

## 2019-11-26 MED ORDER — ONDANSETRON HCL 4 MG/2ML IJ SOLN: 4.0000 mg | Freq: Four times a day (QID) | INTRAMUSCULAR | Status: DC | PRN

## 2019-11-26 MED ORDER — MIDAZOLAM HCL 2 MG/2ML IJ SOLN
INTRAMUSCULAR | Status: AC
  Filled 2019-11-26: qty 2

## 2019-11-26 MED ORDER — DEXMEDETOMIDINE HCL 200 MCG/2ML IV SOLN
INTRAVENOUS | Status: DC | PRN
  Administered 2019-11-26 (×5): 4 ug via INTRAVENOUS

## 2019-11-26 MED ORDER — HYDROMORPHONE HCL 1 MG/ML IJ SOLN: 0.2500 mg | INTRAMUSCULAR | Status: DC | PRN

## 2019-11-26 MED ORDER — MEPERIDINE HCL 25 MG/ML IJ SOLN: 6.2500 mg | INTRAMUSCULAR | Status: DC | PRN

## 2019-11-26 MED ORDER — HYDROCODONE-ACETAMINOPHEN 5-325 MG PO TABS
1.0000 | ORAL_TABLET | ORAL | Status: DC | PRN
  Administered 2019-11-26: 1 via ORAL
  Administered 2019-11-27 (×2): 2 via ORAL
  Filled 2019-11-26 (×2): qty 2
  Filled 2019-11-26: qty 1

## 2019-11-26 MED ORDER — 0.9 % SODIUM CHLORIDE (POUR BTL) OPTIME
TOPICAL | Status: DC | PRN
  Administered 2019-11-26: 1000 mL

## 2019-11-26 MED ORDER — METOCLOPRAMIDE HCL 5 MG PO TABS: 5.0000 mg | ORAL_TABLET | Freq: Three times a day (TID) | ORAL | Status: DC | PRN

## 2019-11-26 MED ORDER — METOCLOPRAMIDE HCL 5 MG/ML IJ SOLN: 5.0000 mg | Freq: Three times a day (TID) | INTRAMUSCULAR | Status: DC | PRN

## 2019-11-26 MED ORDER — CEFAZOLIN SODIUM-DEXTROSE 2-4 GM/100ML-% IV SOLN
2.0000 g | Freq: Four times a day (QID) | INTRAVENOUS | Status: AC
  Administered 2019-11-26 (×3): 2 g via INTRAVENOUS
  Filled 2019-11-26 (×3): qty 100

## 2019-11-26 MED ORDER — METHOCARBAMOL 1000 MG/10ML IJ SOLN
500.0000 mg | Freq: Four times a day (QID) | INTRAVENOUS | Status: DC | PRN
  Filled 2019-11-26: qty 5

## 2019-11-26 MED ORDER — SENNA 8.6 MG PO TABS
1.0000 | ORAL_TABLET | Freq: Two times a day (BID) | ORAL | Status: DC
  Administered 2019-11-26 – 2019-11-28 (×4): 8.6 mg via ORAL
  Filled 2019-11-26 (×5): qty 1

## 2019-11-26 MED ORDER — PROMETHAZINE HCL 25 MG/ML IJ SOLN: 6.2500 mg | INTRAMUSCULAR | Status: DC | PRN

## 2019-11-26 SURGICAL SUPPLY — 82 items
BANDAGE ESMARK 6X9 LF (GAUZE/BANDAGES/DRESSINGS) ×1 IMPLANT
BIT DRILL 100X2.5XANTM LCK (BIT) ×1 IMPLANT
BIT DRILL 2.5X2.75 QC CALB (BIT) ×3 IMPLANT
BIT DRILL CAL (BIT) ×1 IMPLANT
BIT DRL 100X2.5XANTM LCK (BIT) ×1
BNDG ELASTIC 4X5.8 VLCR STR LF (GAUZE/BANDAGES/DRESSINGS) ×3 IMPLANT
BNDG ELASTIC 6X5.8 VLCR STR LF (GAUZE/BANDAGES/DRESSINGS) ×6 IMPLANT
BNDG ESMARK 6X9 LF (GAUZE/BANDAGES/DRESSINGS) ×3
BONE CANC CHIPS 20CC PCAN1/4 (Bone Implant) ×3 IMPLANT
BOOTCOVER CLEANROOM LRG (PROTECTIVE WEAR) ×6 IMPLANT
CHIPS CANC BONE 20CC PCAN1/4 (Bone Implant) ×1 IMPLANT
CLOSURE STERI-STRIP 1/2X4 (GAUZE/BANDAGES/DRESSINGS) ×2
CLSR STERI-STRIP ANTIMIC 1/2X4 (GAUZE/BANDAGES/DRESSINGS) ×4 IMPLANT
COVER SURGICAL LIGHT HANDLE (MISCELLANEOUS) ×3 IMPLANT
COVER WAND RF STERILE (DRAPES) ×3 IMPLANT
CUFF TOURN SGL QUICK 24 (TOURNIQUET CUFF) ×2
CUFF TOURN SGL QUICK 34 (TOURNIQUET CUFF)
CUFF TOURN SGL QUICK 42 (TOURNIQUET CUFF) IMPLANT
CUFF TRNQT CYL 24X4X16.5-23 (TOURNIQUET CUFF) ×1 IMPLANT
CUFF TRNQT CYL 34X4.125X (TOURNIQUET CUFF) IMPLANT
DECANTER SPIKE VIAL GLASS SM (MISCELLANEOUS) IMPLANT
DRAPE C-ARM 42X72 X-RAY (DRAPES) ×3 IMPLANT
DRAPE C-ARMOR (DRAPES) ×3 IMPLANT
DRAPE OEC MINIVIEW 54X84 (DRAPES) IMPLANT
DRAPE ORTHO SPLIT 87X125 STRL (DRAPES) ×6 IMPLANT
DRAPE U-SHAPE 47X51 STRL (DRAPES) ×3 IMPLANT
DRILL BIT 2.5MM (BIT) ×2
DRILL BIT CAL (BIT) ×3
DRSG MEPILEX BORDER 4X8 (GAUZE/BANDAGES/DRESSINGS) ×3 IMPLANT
DRSG PAD ABDOMINAL 8X10 ST (GAUZE/BANDAGES/DRESSINGS) ×3 IMPLANT
DURAPREP 26ML APPLICATOR (WOUND CARE) ×6 IMPLANT
ELECT REM PT RETURN 9FT ADLT (ELECTROSURGICAL) ×3
ELECTRODE REM PT RTRN 9FT ADLT (ELECTROSURGICAL) ×1 IMPLANT
GAUZE SPONGE 4X4 12PLY STRL (GAUZE/BANDAGES/DRESSINGS) ×3 IMPLANT
GAUZE XEROFORM 1X8 LF (GAUZE/BANDAGES/DRESSINGS) ×6 IMPLANT
GLOVE BIOGEL PI ORTHO PRO SZ8 (GLOVE) ×2
GLOVE ORTHO TXT STRL SZ7.5 (GLOVE) ×9 IMPLANT
GLOVE PI ORTHO PRO STRL SZ8 (GLOVE) ×1 IMPLANT
GLOVE SURG ORTHO 8.0 STRL STRW (GLOVE) ×6 IMPLANT
GOWN STRL REUS W/ TWL LRG LVL3 (GOWN DISPOSABLE) IMPLANT
GOWN STRL REUS W/TWL 2XL LVL3 (GOWN DISPOSABLE) ×3 IMPLANT
GOWN STRL REUS W/TWL LRG LVL3 (GOWN DISPOSABLE)
IMMOBILIZER KNEE 20 (SOFTGOODS) ×3
IMMOBILIZER KNEE 20 THIGH 36 (SOFTGOODS) ×1 IMPLANT
K-WIRE ACE 1.6X6 (WIRE) ×3
KIT BASIN OR (CUSTOM PROCEDURE TRAY) ×3 IMPLANT
KIT TURNOVER KIT B (KITS) ×3 IMPLANT
KWIRE ACE 1.6X6 (WIRE) ×1 IMPLANT
MANIFOLD NEPTUNE II (INSTRUMENTS) ×3 IMPLANT
NDL SUT 6 .5 CRC .975X.05 MAYO (NEEDLE) ×1 IMPLANT
NEEDLE MAYO TAPER (NEEDLE) ×2
NS IRRIG 1000ML POUR BTL (IV SOLUTION) ×3 IMPLANT
PACK ORTHO EXTREMITY (CUSTOM PROCEDURE TRAY) ×3 IMPLANT
PAD ARMBOARD 7.5X6 YLW CONV (MISCELLANEOUS) ×6 IMPLANT
PAD CAST 4YDX4 CTTN HI CHSV (CAST SUPPLIES) ×2 IMPLANT
PADDING CAST COTTON 4X4 STRL (CAST SUPPLIES) ×4
PLATE LOCK 5H STD LT PROX TIB (Plate) ×3 IMPLANT
SCREW CORTICAL 3.5MM 36MM (Screw) ×3 IMPLANT
SCREW CORTICAL 3.5MM 38MM (Screw) ×3 IMPLANT
SCREW CORTICAL 3.5MM 40MM (Screw) ×3 IMPLANT
SCREW LOCK CORT STAR 3.5X65 (Screw) ×6 IMPLANT
SCREW LOCK CORT STAR 3.5X70 (Screw) ×6 IMPLANT
SCREW LOCK CORT STAR 3.5X75 (Screw) ×3 IMPLANT
SCREW LOW PROF CORTICAL 3.5X80 (Screw) ×3 IMPLANT
SPONGE LAP 4X18 RFD (DISPOSABLE) ×6 IMPLANT
STAPLER VISISTAT 35W (STAPLE) ×3 IMPLANT
SUCTION FRAZIER HANDLE 10FR (MISCELLANEOUS) ×4
SUCTION TUBE FRAZIER 10FR DISP (MISCELLANEOUS) ×2 IMPLANT
SUT FIBERWIRE 2-0 18 17.9 3/8 (SUTURE) ×12
SUT VIC AB 0 CT1 27 (SUTURE) ×4
SUT VIC AB 0 CT1 27XBRD ANBCTR (SUTURE) ×2 IMPLANT
SUT VIC AB 2-0 CT1 27 (SUTURE) ×4
SUT VIC AB 2-0 CT1 TAPERPNT 27 (SUTURE) ×2 IMPLANT
SUTURE FIBERWR 2-0 18 17.9 3/8 (SUTURE) ×4 IMPLANT
SYR CONTROL 10ML LL (SYRINGE) IMPLANT
TOWEL GREEN STERILE (TOWEL DISPOSABLE) ×3 IMPLANT
TOWEL GREEN STERILE FF (TOWEL DISPOSABLE) ×3 IMPLANT
TUBE CONNECTING 12'X1/4 (SUCTIONS) ×1
TUBE CONNECTING 12X1/4 (SUCTIONS) ×2 IMPLANT
UNDERPAD 30X30 (UNDERPADS AND DIAPERS) ×3 IMPLANT
WATER STERILE IRR 1000ML POUR (IV SOLUTION) ×3 IMPLANT
YANKAUER SUCT BULB TIP NO VENT (SUCTIONS) ×3 IMPLANT

## 2019-11-26 NOTE — Progress Notes (Signed)
The patient has been re-examined, and the chart reviewed, and there have been no interval changes to the documented history and physical.    The risks benefits and alternatives were discussed with the patient including but not limited to the risks of nonoperative treatment, versus surgical intervention including infection, bleeding, nerve injury, malunion, nonunion, the need for revision surgery, hardware prominence, hardware failure, the need for hardware removal, blood clots, cardiopulmonary complications, morbidity, mortality, among others, and they were willing to proceed.    Johnny Bridge, MD

## 2019-11-26 NOTE — Transfer of Care (Signed)
Immediate Anesthesia Transfer of Care Note  Patient: Shawn Mcknight  Procedure(s) Performed: OPEN REDUCTION INTERNAL FIXATION (ORIF) TIBIAL PLATEAU (Left Leg Upper)  Patient Location: PACU  Anesthesia Type:General  Level of Consciousness: drowsy  Airway & Oxygen Therapy: Patient Spontanous Breathing and Patient connected to face mask oxygen  Post-op Assessment: Report given to RN and Post -op Vital signs reviewed and stable  Post vital signs: Reviewed and stable  Last Vitals:  Vitals Value Taken Time  BP 123/59 11/26/19 1057  Temp    Pulse 100 11/26/19 1059  Resp 22 11/26/19 1059  SpO2 93 % 11/26/19 1059  Vitals shown include unvalidated device data.  Last Pain:  Vitals:   11/26/19 0404  TempSrc: Oral  PainSc:          Complications: No apparent anesthesia complications

## 2019-11-26 NOTE — Discharge Instructions (Signed)
Diet: As you were doing prior to hospitalization   Shower:  May shower but keep the wounds dry, use an occlusive plastic wrap, NO SOAKING IN TUB.  If the bandage gets wet, change with a clean dry gauze.  If you have a splint on, leave the splint in place and keep the splint dry with a plastic bag.  Dressing:  You may change your dressing 3-5 days after surgery, unless you have a splint.  If you have a splint, then just leave the splint in place and we will change your bandages during your first follow-up appointment.    If you had hand or foot surgery, we will plan to remove your stitches in about 2 weeks in the office.  For all other surgeries, there are sticky tapes (steri-strips) on your wounds and all the stitches are absorbable.  Leave the steri-strips in place when changing your dressings, they will peel off with time, usually 2-3 weeks.  Activity:  Increase activity slowly as tolerated, but follow the weight bearing instructions below.  The rules on driving is that you can not be taking narcotics while you drive, and you must feel in control of the vehicle.    Weight Bearing:   Non weight bearing left leg.    To prevent constipation: you may use a stool softener such as -  Colace (over the counter) 100 mg by mouth twice a day  Drink plenty of fluids (prune juice may be helpful) and high fiber foods Miralax (over the counter) for constipation as needed.    Itching:  If you experience itching with your medications, try taking only a single pain pill, or even half a pain pill at a time.  You may take up to 10 pain pills per day, and you can also use benadryl over the counter for itching or also to help with sleep.   Precautions:  If you experience chest pain or shortness of breath - call 911 immediately for transfer to the hospital emergency department!!  If you develop a fever greater that 101 F, purulent drainage from wound, increased redness or drainage from wound, or calf pain -- Call  the office at [phone]                                                Follow- Up Appointment:  Please call for an appointment to be seen in 2 weeks [place] - [phone]     

## 2019-11-26 NOTE — Anesthesia Postprocedure Evaluation (Signed)
Anesthesia Post Note  Patient: Shawn Mcknight  Procedure(s) Performed: OPEN REDUCTION INTERNAL FIXATION (ORIF) TIBIAL PLATEAU (Left Leg Upper)     Patient location during evaluation: PACU Anesthesia Type: General Level of consciousness: sedated and patient cooperative Pain management: pain level controlled Vital Signs Assessment: post-procedure vital signs reviewed and stable Respiratory status: spontaneous breathing Cardiovascular status: stable Anesthetic complications: no    Last Vitals:  Vitals:   11/26/19 1439 11/26/19 1535  BP: 137/70 (!) 121/54  Pulse: 96 98  Resp: 17 18  Temp: 36.7 C 36.9 C  SpO2: 97% 97%    Last Pain:  Vitals:   11/26/19 1535  TempSrc: Oral  PainSc:                  [name]

## 2019-11-26 NOTE — Progress Notes (Addendum)
Pt back to unit. From PACU Alert/oriented in no apparent distress.No bleeding at site noted at this time from ace wrap. s/p left closed fx of the laterail tibial plateau.

## 2019-11-26 NOTE — Plan of Care (Signed)
  Problem: Education: Goal: Knowledge of General Education information will improve Description: Including pain rating scale, medication(s)/side effects and non-pharmacologic comfort measures Outcome: Progressing   Problem: Health Behavior/Discharge Planning: Goal: Ability to manage health-related needs will improve Outcome: Progressing   Problem: Activity: Goal: Risk for activity intolerance will decrease Outcome: Progressing   Problem: Nutrition: Goal: Adequate nutrition will be maintained Outcome: Progressing   Problem: Coping: Goal: Level of anxiety will decrease Outcome: Progressing   Problem: Elimination: Goal: Will not experience complications related to bowel motility Outcome: Progressing   Problem: Pain Managment: Goal: General experience of comfort will improve Outcome: Progressing   Problem: Safety: Goal: Ability to remain free from injury will improve Outcome: Progressing   

## 2019-11-26 NOTE — Op Note (Signed)
11/25/2019 - 11/26/2019  10:26 AM  PATIENT:  Shawn Mcknight    PRE-OPERATIVE DIAGNOSIS:  CLOSED FRACTURE LEFT TIBIAL PLATEAU  POST-OPERATIVE DIAGNOSIS:    1.  Left closed fracture of the lateral tibial plateau 2.  Left lateral meniscus tear  PROCEDURE:    1.  Open reduction internal fixation left lateral tibial plateau fracture 2.  Open repair of left lateral meniscus tear 3.  Anterior compartment fasciotomy  SURGEON:  Johnny Bridge, MD  PHYSICIAN ASSISTANT: Merlene Pulling, PA-C, present and scrubbed throughout the case, critical for completion in a timely fashion, and for retraction, instrumentation, and closure.  ANESTHESIA:   General  PREOPERATIVE INDICATIONS:  Shawn Mcknight is a  29 y.o. male with a diagnosis of CLOSED FRACTURE LEFT TIBIAL PLATEAU who elected for surgical management.    The risks benefits and alternatives were discussed with the patient preoperatively including but not limited to the risks of infection, bleeding, nerve injury, cardiopulmonary complications, the need for revision surgery, among others, and the patient was willing to proceed.  We also discussed the risks for compartment syndrome, posttraumatic arthrosis, DVT, among others.  ESTIMATED BLOOD LOSS: 75 mL  OPERATIVE IMPLANTS: Zimmer proximal tibial locking plate with 3 distal cortical screws, 4 proximal locking screws, and 1 additional proximal locking screw which I exchanged from a nonlocking screw that was bicortical to compress the plate used in the second row.  I used a total of 4 2-0 FiberWire for the meniscal repair.  OPERATIVE FINDINGS: There was a substantially comminuted impacted intra-articular tibial plateau fracture.  The meniscus was completely torn circumferentially from anterior to posterior off of the capsule, but I was able to get an excellent repair.  There was substantial depression at the central portion and posterior portion of the plateau.  OPERATIVE PROCEDURE: The patient was  brought to the operating room and placed in supine position.  General anesthesia was administered.  IV antibiotics were given.  The left lower extremity was prescrubbed, clipped, and then prepped and draped in usual sterile fashion.  There is a couple of nicks from the clipping, which I covered with Tegaderm.  Timeout was performed.  The leg was elevated and exsanguinated and the tourniquet was inflated to 300 mmHg for approximately 1 hour and 50 minutes.  Curvilinear incision was made over the proximal tibia and the fascia was incised and anterior compartment fasciotomy was carried out extending the fascial release distally along the crest of the tibia.  The muscle was elevated, the IT band incised, in line with its fibers, and the capsule was elevated off of the crest of the tibia.  It was at this point that I realized I could not find the lateral meniscus.  After elevation of the capsule, I open the fracture site, evacuated the hematoma, and found the meniscus tucked medially and posteriorly.  I restored it back to its anatomic position and placed a total of four 2-0 FiberWire through the capsule and the meniscus substance itself.  I had excellent restoration of anatomic alignment, and I secured this time the sutures.  I left the suture limbs in place, and then turned my attention back to the fracture.  The medial and posterior impacted bone was elevated with a Cobb elevator, and then I used cancellous bone chips to augment the elevation in height and restore bone depression.  I then closed the window of the articular surface laterally, held in place with a clamp, confirmed anatomic reduction under live C-arm as well as  AP, internal, and external rotation views along with lateral views.  I applied a proximal tibial plate, confirmed its position with C-arm, placed a screw into the shaft to compress the plate to its anatomic position, and then placed a guidepin through one of the locking holes proximally.   I was satisfied with the position and on the alignment, and I secured the plate distally with 1 more nonlocking screw.  I then placed a nonlocking screw in the second row of the proximal portion of the plate, 2 try and close down the condylar width, and then secured the remaining portions of the plate filling the proximal row with locking screws.  I exchanged the distal nonlocking screw which was bicortical for a unicortical locking screw in order to minimize medial prominence.  I placed 1 final cortical screw into the distal segment of the plate.  I evaluated the final reduction and felt that it was as close to anatomic as I could possibly achieve, and then placed the FiberWire sutures from all of the meniscal repair back through the plate in the respective holes in the plate, tied these over each other with each limb, and then cut the FiberWire, irrigated the wounds copiously, drop the tourniquet, and repaired the fascia with 0 Vicryl followed by 2-0 and 3-0 Vicryl for subcutaneous tissue and the skin, along with Steri-Strips and sterile gauze and a knee immobilizer.  He was awakened and returned to the PACU in stable and satisfactory condition and there were no complications and he tolerated the procedure well.

## 2019-11-26 NOTE — Plan of Care (Signed)

## 2019-11-26 NOTE — Anesthesia Preprocedure Evaluation (Signed)
Anesthesia Evaluation  Patient identified by MRN, date of birth, ID band Patient awake    Reviewed: Allergy & Precautions, NPO status , Patient's Chart, lab work & pertinent test results  Airway Mallampati: II  TM Distance: >3 FB Neck ROM: Full    Dental no notable dental hx. (+) Dental Advisory Given   Pulmonary neg pulmonary ROS,    Pulmonary exam normal breath sounds clear to auscultation       Cardiovascular negative cardio ROS Normal cardiovascular exam Rhythm:Regular Rate:Normal     Neuro/Psych negative neurological ROS  negative psych ROS   GI/Hepatic Neg liver ROS, GERD  ,  Endo/Other  Morbid obesity  Renal/GU negative Renal ROS     Musculoskeletal negative musculoskeletal ROS (+)   Abdominal (+) + obese,   Peds  Hematology negative hematology ROS (+)   Anesthesia Other Findings   Reproductive/Obstetrics                             Anesthesia Physical Anesthesia Plan  ASA: III  Anesthesia Plan: General   Post-op Pain Management:    Induction: Intravenous  PONV Risk Score and Plan: 3 and Ondansetron, Dexamethasone, Midazolam and Treatment may vary due to age or medical condition  Airway Management Planned: Oral ETT  Additional Equipment:   Intra-op Plan:   Post-operative Plan: Extubation in OR  Informed Consent: I have reviewed the patients History and Physical, chart, labs and discussed the procedure including the risks, benefits and alternatives for the proposed anesthesia with the patient or authorized representative who has indicated his/her understanding and acceptance.     Dental advisory given  Plan Discussed with: CRNA  Anesthesia Plan Comments:         Anesthesia Quick Evaluation

## 2019-11-26 NOTE — Anesthesia Procedure Notes (Signed)
Procedure Name: Intubation Date/Time: 11/26/2019 7:43 AM Performed by: Clearnce Sorrel, CRNA Pre-anesthesia Checklist: Patient identified, Emergency Drugs available, Suction available, Patient being monitored and Timeout performed Patient Re-evaluated:Patient Re-evaluated prior to induction Oxygen Delivery Method: Circle system utilized Preoxygenation: Pre-oxygenation with 100% oxygen Induction Type: IV induction Ventilation: Mask ventilation without difficulty Laryngoscope Size: Mac, 4 and Glidescope Grade View: Grade III Tube type: Oral Tube size: 7.5 mm Number of attempts: 2 Airway Equipment and Method: Stylet Placement Confirmation: ETT inserted through vocal cords under direct vision,  positive ETCO2 and breath sounds checked- equal and bilateral Secured at: 23 cm Tube secured with: Tape Dental Injury: Teeth and Oropharynx as per pre-operative assessment

## 2019-11-27 ENCOUNTER — Encounter: Payer: Self-pay | Admitting: *Deleted

## 2019-11-27 ENCOUNTER — Inpatient Hospital Stay (HOSPITAL_COMMUNITY): Payer: 59
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
## 2019-11-27 NOTE — Plan of Care (Signed)
  Problem: Education: Goal: Knowledge of General Education information will improve Description: Including pain rating scale, medication(s)/side effects and non-pharmacologic comfort measures Outcome: Progressing   Problem: Health Behavior/Discharge Planning: Goal: Ability to manage health-related needs will improve Outcome: Progressing   Problem: Clinical Measurements: Goal: Will remain free from infection Outcome: Progressing   Problem: Activity: Goal: Risk for activity intolerance will decrease Outcome: Progressing   Problem: Pain Managment: Goal: General experience of comfort will improve Outcome: Progressing   Problem: Safety: Goal: Ability to remain free from injury will improve Outcome: Progressing   
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
## 2019-11-27 NOTE — Progress Notes (Signed)
Patient ambulated in the room with walker, knee immobilizer on, and one assist.  He tolerated the walk very well remembering not to put weight on left leg.  He walked around 30 feet total.
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
## 2019-11-28 NOTE — Progress Notes (Signed)
D/C instructions given. Out via W/C to car with volunteer and friend.  Earlier offered an afternoon PT but declined.
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
## 2019-11-28 NOTE — Plan of Care (Signed)
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
## 2019-11-28 NOTE — TOC Progression Note (Signed)
Transition of Care [facility]) - Progression Note    Patient Details  Name: Shawn Mcknight MRN: [id number] Date of Birth: 1991-06-24  Transition of [facility]) CM/SW Contact  Curlene Labrum, RN Phone Number: 11/28/2019, 12:29 PM  Clinical Narrative:    Case management spoke with the patient this morning regarding needed assistance for grocery shopping and private aid assistance at home.  Patient referred to private agencies in the [place] are that provide this assistance private pay - patient given information to contact for help if needed.  I called [facility], [facility], [facility], [facility], [facility] [place] and was unable to provide home health services to the patient due to being out of network with [facility] and/or not having staffing availability.  Patient informed of this matter in inavailability for home health services.  Patient plans to call private list of agencies for aide assistance for meal prep if needed and grocery shopping - otherwise, patient will be getting assistance from neighbors.   Expected Discharge Plan: Home/Self Care Barriers to Discharge: (Patient received 3:1 and RW from [facility].)  Expected Discharge Plan and Services Expected Discharge Plan: Home/Self Care   Discharge Planning Services: CM Consult Post Acute Care Choice: Durable Medical Equipment Living arrangements for the past 2 months: Apartment Expected Discharge Date: 11/28/19               DME Arranged: Gilford Rile rolling, 3-N-1   Date DME Agency Contacted: 11/27/19 Time DME Agency Contacted: [phone] Representative spoke with at DME Agency: Thedore Mins HH Arranged: PT, Nurse's Aide [place] Agency: [facility] ([facility])(Waiting on return call to confirm availability) Date [place]: 11/28/19 Time [place]: 43 Representative spoke with at [place]: Joen Laura, RNCM   Social Determinants of Health ([place])  Interventions    Readmission Risk Interventions Readmission Risk Prevention Plan 11/27/2019  Post Dischage Appt Complete  Medication Screening Complete  Transportation Screening Complete
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
## 2019-11-28 NOTE — Plan of Care (Signed)
  Problem: Education: Goal: Knowledge of General Education information will improve Description: Including pain rating scale, medication(s)/side effects and non-pharmacologic comfort measures 11/28/2019 1223 by Melina Schools, RN Outcome: Adequate for Discharge 11/28/2019 0814 by Melina Schools, RN Outcome: Adequate for Discharge   Problem: Health Behavior/Discharge Planning: Goal: Ability to manage health-related needs will improve 11/28/2019 1223 by Melina Schools, RN Outcome: Adequate for Discharge 11/28/2019 [phone] by Melina Schools, RN Outcome: Adequate for Discharge   Problem: Clinical Measurements: Goal: Ability to maintain clinical measurements within normal limits will improve 11/28/2019 1223 by Melina Schools, RN Outcome: Adequate for Discharge 11/28/2019 0814 by Melina Schools, RN Outcome: Adequate for Discharge Goal: Will remain free from infection 11/28/2019 1223 by Melina Schools, RN Outcome: Adequate for Discharge 11/28/2019 0814 by Melina Schools, RN Outcome: Adequate for Discharge Goal: Diagnostic test results will improve 11/28/2019 1223 by Melina Schools, RN Outcome: Adequate for Discharge 11/28/2019 [phone] by Melina Schools, RN Outcome: Adequate for Discharge Goal: Respiratory complications will improve 11/28/2019 1223 by Melina Schools, RN Outcome: Adequate for Discharge 11/28/2019 [phone] by Melina Schools, RN Outcome: Adequate for Discharge Goal: Cardiovascular complication will be avoided 11/28/2019 1223 by Melina Schools, RN Outcome: Adequate for Discharge 11/28/2019 [phone] by Melina Schools, RN Outcome: Adequate for Discharge   Problem: Activity: Goal: Risk for activity intolerance will decrease 11/28/2019 1223 by Melina Schools, RN Outcome: Adequate for Discharge 11/28/2019 [phone] by Melina Schools, RN Outcome: Adequate for Discharge   Problem: Nutrition: Goal: Adequate nutrition will be maintained 11/28/2019 1223 by  Melina Schools, RN Outcome: Adequate for Discharge 11/28/2019 [phone] by Melina Schools, RN Outcome: Adequate for Discharge   Problem: Coping: Goal: Level of anxiety will decrease 11/28/2019 1223 by Melina Schools, RN Outcome: Adequate for Discharge 11/28/2019 [phone] by Melina Schools, RN Outcome: Adequate for Discharge   Problem: Elimination: Goal: Will not experience complications related to bowel motility 11/28/2019 1223 by Melina Schools, RN Outcome: Adequate for Discharge 11/28/2019 0814 by Melina Schools, RN Outcome: Adequate for Discharge Goal: Will not experience complications related to urinary retention 11/28/2019 1223 by Melina Schools, RN Outcome: Adequate for Discharge 11/28/2019 0814 by Melina Schools, RN Outcome: Adequate for Discharge   Problem: Pain Managment: Goal: General experience of comfort will improve 11/28/2019 1223 by Melina Schools, RN Outcome: Adequate for Discharge 11/28/2019 [phone] by Melina Schools, RN Outcome: Adequate for Discharge   Problem: Safety: Goal: Ability to remain free from injury will improve 11/28/2019 1223 by Melina Schools, RN Outcome: Adequate for Discharge 11/28/2019 [phone] by Melina Schools, RN Outcome: Adequate for Discharge   Problem: Skin Integrity: Goal: Risk for impaired skin integrity will decrease 11/28/2019 1223 by Melina Schools, RN Outcome: Adequate for Discharge 11/28/2019 [phone] by Melina Schools, RN Outcome: Adequate for Discharge

## 2019-11-29 ENCOUNTER — Ambulatory Visit: Payer: 59 | Admitting: Registered"

## 2019-12-07 ENCOUNTER — Ambulatory Visit: Payer: 59 | Admitting: Skilled Nursing Facility1

## 2020-05-03 ENCOUNTER — Other Ambulatory Visit: Payer: Self-pay

## 2020-05-03 ENCOUNTER — Telehealth: Payer: Self-pay

## 2020-05-03 ENCOUNTER — Ambulatory Visit (INDEPENDENT_AMBULATORY_CARE_PROVIDER_SITE_OTHER): Payer: 59 | Admitting: Podiatry

## 2020-05-03 ENCOUNTER — Encounter: Payer: Self-pay | Admitting: Podiatry

## 2020-05-03 ENCOUNTER — Telehealth: Payer: Self-pay | Admitting: Podiatry

## 2020-05-03 DIAGNOSIS — L600 Ingrowing nail: Secondary | ICD-10-CM | POA: Diagnosis not present

## 2020-05-03 MED ORDER — NEOMYCIN-POLYMYXIN-HC 3.5-10000-1 OT SOLN: OTIC | 0 refills | Status: DC

## 2020-05-03 MED ORDER — GENTAMICIN 1 MG/ML + HEPARIN 2500 UNITS/ML ABX LOCK SOLN: 2.5000 mL | 0 refills | Status: AC

## 2020-05-03 NOTE — Telephone Encounter (Signed)
Patient called back and prescription still has not been sent in.

## 2020-05-03 NOTE — Patient Instructions (Signed)

## 2020-05-03 NOTE — Addendum Note (Signed)
Addended by: Lind Guest on: 05/03/2020 03:01 PM   Modules accepted: Orders
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
## 2020-05-03 NOTE — Progress Notes (Signed)
Subjective:   Patient ID: Shawn Mcknight, male   DOB: 29 y.o.   MRN: [id number]   HPI Patient states have had a badly ingrown toenail my left big toe and I have problems with my right 1 2.  States that it is been sore and had some previous drainage which is improved but is still very tender and is trying to soak it and trim it without relief and patient does not smoke likes to be active   Review of Systems  All other systems reviewed and are negative.       Objective:  Physical Exam Vitals and nursing note reviewed.  Constitutional:      Appearance: He is well-developed.  Pulmonary:     Effort: Pulmonary effort is normal.  Musculoskeletal:        General: Normal range of motion.  Skin:    General: Skin is warm.  Neurological:     Mental Status: He is alert.     Neurovascular status intact muscle strength found to be adequate range of motion within normal limits.  Patient was noted to have incurvated hallux nail border medial lateral left lateral border right that are painful when pressed with distal redness and soreness.  No drainage was noted from any of the borders and they are painful when palpated patient has good digital perfusion well oriented x3     Assessment:  Ingrown toenail deformity hallux bilateral with pain     Plan:  H&P all conditions reviewed and I went ahead and recommended correction allowing him to read consent form for correction of nail borders.  Patient wants surgery signed consent form understanding risk and today I infiltrated each hallux 60 mg like Marcaine mixture sterile prep done using sterile instrumentation remove the medial lateral border left hallux lateral border right hallux exposed matrix and applied 3 applications chemical to each border followed by alcohol lavage sterile dressings gave instructions to leave dressings on 24 hours but take them off earlier if any throbbing were to occur and patient will be seen back for [place] to recheck.  Patient  is encouraged to call with any questions concerns

## 2020-05-03 NOTE — Telephone Encounter (Signed)
Rx sent to patient's pharmacy -[facility]

## 2020-05-03 NOTE — Telephone Encounter (Signed)
error 

## 2020-05-13 ENCOUNTER — Telehealth: Payer: Self-pay | Admitting: Podiatry

## 2020-05-13 NOTE — Telephone Encounter (Signed)
Spoke with Pt and answered his questions about soaking.

## 2020-05-13 NOTE — Telephone Encounter (Signed)
Pt is soaking left great toe like he was instructed. The toe is still bleeding. He would like someone to give him a call about what to do next. He stated that he was not given clear instructions on how to treat his toe.

## 2020-05-24 ENCOUNTER — Ambulatory Visit (INDEPENDENT_AMBULATORY_CARE_PROVIDER_SITE_OTHER): Payer: 59 | Admitting: Podiatry

## 2020-05-24 ENCOUNTER — Encounter: Payer: Self-pay | Admitting: Podiatry

## 2020-05-24 ENCOUNTER — Other Ambulatory Visit: Payer: Self-pay

## 2020-05-24 DIAGNOSIS — L03032 Cellulitis of left toe: Secondary | ICD-10-CM

## 2020-05-24 DIAGNOSIS — L600 Ingrowing nail: Secondary | ICD-10-CM | POA: Diagnosis not present

## 2020-05-24 MED ORDER — DOXYCYCLINE HYCLATE 100 MG PO TABS: 100.0000 mg | ORAL_TABLET | Freq: Two times a day (BID) | ORAL | 0 refills | Status: DC

## 2020-05-24 NOTE — Progress Notes (Signed)
Subjective:  Patient ID: Shawn Mcknight, male    DOB: 1990/09/28,  MRN: [id number]  Chief Complaint  Patient presents with  . Nail Problem    Left hallux nail. skin is peeling around the nail and it is red and yesterday it was swollen     29 y.o. male presents with the above complaint. Patient presents with complaint of left hallux mild paronychia/redness. Patient had a ingrown nail procedure that was done by Dr. Paulla Dolly 3 weeks ago. Patient states there is some redness and swelling associated with it. There is also some epidermolysis is still present. He wants to make sure that there is no infection present. He states the right side healed really well. He denies any other acute complaints. He would like to discuss treatment options for this.   Review of Systems: Negative except as noted in the HPI. Denies N/V/F/Ch.  Past Medical History:  Diagnosis Date  . Bradycardia   . Closed fracture of lateral portion of left tibial plateau 11/25/2019  . GERD (gastroesophageal reflux disease)   . IBS (irritable bowel syndrome)   . Lactose intolerance   . Obesity     Current Outpatient Medications:  .  aspirin EC 325 MG tablet, Take 1 tablet (325 mg total) by mouth 2 (two) times daily., Disp: 60 tablet, Rfl: 0 .  baclofen (LIORESAL) 10 MG tablet, Take 1 tablet (10 mg total) by mouth 3 (three) times daily. As needed for muscle spasm, Disp: 50 tablet, Rfl: 0 .  doxycycline (VIBRA-TABS) 100 MG tablet, Take 1 tablet (100 mg total) by mouth 2 (two) times daily., Disp: 20 tablet, Rfl: 0 .  HYDROcodone-acetaminophen (NORCO) 10-325 MG tablet, Take 1 tablet by mouth every 6 (six) hours as needed., Disp: 28 tablet, Rfl: 0 .  Inulin (FIBER CHOICE PO), Take 1 tablet by mouth 3 (three) times daily with meals., Disp: , Rfl:  .  neomycin-polymyxin-hydrocortisone (CORTISPORIN) OTIC solution, 1-2 drops to toe twice daily after soaking, Disp: 10 mL, Rfl: 0 .  omeprazole (PRILOSEC) 40 MG capsule, Take 1 capsule (40 mg  total) by mouth 2 (two) times daily. (Patient taking differently: Take 40 mg by mouth daily. ), Disp: 60 capsule, Rfl: 3 .  Propylene Glycol (SYSTANE COMPLETE) 0.6 % SOLN, Place 1 drop into both eyes daily., Disp: , Rfl:  .  sennosides-docusate sodium (SENOKOT-S) 8.6-50 MG tablet, Take 2 tablets by mouth daily., Disp: 30 tablet, Rfl: 1 .  simethicone (MYLICON) 124 MG chewable tablet, Chew 1 tablet (125 mg total) by mouth every 6 (six) hours as needed for flatulence (bloating)., Disp: 30 tablet, Rfl: 0  Social History   Tobacco Use  Smoking Status Never Smoker  Smokeless Tobacco Never Used    Allergies  Allergen Reactions  . Shellfish Allergy Swelling    Lips swelling  . Lactose Intolerance (Gi) Other (See Comments)    Bloating, gas   Objective:  There were no vitals filed for this visit. There is no height or weight on file to calculate BMI. Constitutional Well developed. Well nourished.  Vascular Dorsalis pedis pulses palpable bilaterally. Posterior tibial pulses palpable bilaterally. Capillary refill normal to all digits.  No cyanosis or clubbing noted. Pedal hair growth normal.  Neurologic Normal speech. Oriented to person, place, and time. Epicritic sensation to light touch grossly present bilaterally.  Dermatologic  mild paronychia/erythema noted circumferentially around the left hallux. No purulent drainage was expressed. The ingrown site has healed well.  Orthopedic: Normal joint ROM without pain or crepitus bilaterally.  No visible deformities. No bony tenderness.   Radiographs: None Assessment:   1. Paronychia of toe of left foot due to ingrown toenail    Plan:  Patient was evaluated and treated and all questions answered.  Left hallux mild paronychia -I explained patient the etiology of paronychia and various treatment options were discussed. At this time I believe patient will benefit from a round of antibiotics p.o. with doxycycline for 10 days. Patient states  understanding will take the entire course. -This could also be attributed to a mild chemical reaction from phenol that could lead to some redness. I discussed this with the patient as well. Patient states understanding.  No follow-ups on file.

## 2020-06-12 ENCOUNTER — Ambulatory Visit (INDEPENDENT_AMBULATORY_CARE_PROVIDER_SITE_OTHER): Payer: 59 | Admitting: Podiatry

## 2020-06-12 ENCOUNTER — Other Ambulatory Visit: Payer: Self-pay

## 2020-06-12 ENCOUNTER — Ambulatory Visit: Payer: 59

## 2020-06-12 DIAGNOSIS — R52 Pain, unspecified: Secondary | ICD-10-CM | POA: Diagnosis not present

## 2020-06-12 DIAGNOSIS — L03032 Cellulitis of left toe: Secondary | ICD-10-CM | POA: Diagnosis not present

## 2020-06-12 DIAGNOSIS — L600 Ingrowing nail: Secondary | ICD-10-CM | POA: Diagnosis not present

## 2020-06-14 ENCOUNTER — Encounter: Payer: Self-pay | Admitting: Podiatry

## 2020-06-14 NOTE — Progress Notes (Signed)
Subjective:  Patient ID: Shawn Mcknight, male    DOB: [birth year],  MRN: [id number]  Chief Complaint  Patient presents with  . Nail Problem    pt states that after having ingrown toenail removed his toe has been red,swollen and painful left great toe     29 y.o. male presents with the above complaint.  Patient presents with a follow-up of left hallux mild paronychia/redness after an ingrown nail procedure done by Dr. Paulla Dolly.  He states he is doing much better in terms of redness.  He completed his antibiotic course.  He would like to know if this is going back orifice, and the weight is healing.  He denies any other acute complaints.   Review of Systems: Negative except as noted in the HPI. Denies N/V/F/Ch.  Past Medical History:  Diagnosis Date  . Bradycardia   . Closed fracture of lateral portion of left tibial plateau 11/25/2019  . GERD (gastroesophageal reflux disease)   . IBS (irritable bowel syndrome)   . Lactose intolerance   . Obesity     Current Outpatient Medications:  .  Inulin (FIBER CHOICE PO), Take 1 tablet by mouth 3 (three) times daily with meals., Disp: , Rfl:  .  neomycin-polymyxin-hydrocortisone (CORTISPORIN) OTIC solution, 1-2 drops to toe twice daily after soaking, Disp: 10 mL, Rfl: 0 .  omeprazole (PRILOSEC) 40 MG capsule, Take 1 capsule (40 mg total) by mouth 2 (two) times daily. (Patient taking differently: Take 40 mg by mouth daily. ), Disp: 60 capsule, Rfl: 3 .  Propylene Glycol (SYSTANE COMPLETE) 0.6 % SOLN, Place 1 drop into both eyes daily., Disp: , Rfl:  .  simethicone (MYLICON) 627 MG chewable tablet, Chew 1 tablet (125 mg total) by mouth every 6 (six) hours as needed for flatulence (bloating)., Disp: 30 tablet, Rfl: 0 .  aspirin EC 325 MG tablet, Take 1 tablet (325 mg total) by mouth 2 (two) times daily., Disp: 60 tablet, Rfl: 0 .  baclofen (LIORESAL) 10 MG tablet, Take 1 tablet (10 mg total) by mouth 3 (three) times daily. As needed for muscle spasm, Disp:  50 tablet, Rfl: 0 .  doxycycline (VIBRA-TABS) 100 MG tablet, Take 1 tablet (100 mg total) by mouth 2 (two) times daily., Disp: 20 tablet, Rfl: 0 .  HYDROcodone-acetaminophen (NORCO) 10-325 MG tablet, Take 1 tablet by mouth every 6 (six) hours as needed., Disp: 28 tablet, Rfl: 0 .  sennosides-docusate sodium (SENOKOT-S) 8.6-50 MG tablet, Take 2 tablets by mouth daily., Disp: 30 tablet, Rfl: 1  Social History   Tobacco Use  Smoking Status Never Smoker  Smokeless Tobacco Never Used    Allergies  Allergen Reactions  . Shellfish Allergy Swelling    Lips swelling  . Lactose Intolerance (Gi) Other (See Comments)    Bloating, gas   Objective:  There were no vitals filed for this visit. There is no height or weight on file to calculate BMI. Constitutional Well developed. Well nourished.  Vascular Dorsalis pedis pulses palpable bilaterally. Posterior tibial pulses palpable bilaterally. Capillary refill normal to all digits.  No cyanosis or clubbing noted. Pedal hair growth normal.  Neurologic Normal speech. Oriented to person, place, and time. Epicritic sensation to light touch grossly present bilaterally.  Dermatologic  resolved paronychia/erythema noted circumferentially around the left hallux. No purulent drainage was expressed. The ingrown site has healed well.  Orthopedic: Normal joint ROM without pain or crepitus bilaterally. No visible deformities. No bony tenderness.   Radiographs: None Assessment:   1. Pain  2. Paronychia of toe of left foot due to ingrown toenail   3. Ingrown nail    Plan:  Patient was evaluated and treated and all questions answered.  Left hallux mild paronychia -Clinically the redness improved considerably he no longer has any erythema around the nail border.  At this time we will continue to monitor and if there is a regression of the nail and there is pain associated with it that is not tolerable I have asked him to come back and see me.  He  states understanding and he will.  No follow-ups on file.

## 2020-07-09 ENCOUNTER — Encounter: Payer: 59 | Attending: Family Medicine | Admitting: Dietician

## 2020-07-09 ENCOUNTER — Other Ambulatory Visit: Payer: Self-pay

## 2020-07-09 ENCOUNTER — Encounter: Payer: Self-pay | Admitting: Dietician

## 2020-07-09 DIAGNOSIS — K760 Fatty (change of) liver, not elsewhere classified: Secondary | ICD-10-CM | POA: Insufficient documentation

## 2020-07-09 DIAGNOSIS — R7989 Other specified abnormal findings of blood chemistry: Secondary | ICD-10-CM | POA: Diagnosis present

## 2020-07-09 NOTE — Patient Instructions (Addendum)
Choose lower fat, lower calorie options when you eat away from home.  Keep better food options around for when you become stressed. Plain rice cakes are a good option.  Work towards recognizing sources of saturated fats. Animal products, and packaged foods are the main sources.  Work towards drinking less soda, and more "Zero" sodas and seltzers.  Take a food log of what you eat (cooked or raw), and when you experience symptoms.  Begin to prepare your meals like the balanced plate handout!  Work towards 42 oz/2 L of water a day!
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
## 2020-08-03 ENCOUNTER — Ambulatory Visit: Payer: 59 | Attending: Internal Medicine

## 2020-08-03 DIAGNOSIS — Z23 Encounter for immunization: Secondary | ICD-10-CM

## 2020-08-03 NOTE — Progress Notes (Signed)
   Covid-19 Vaccination Clinic  Name:  Shawn Mcknight    MRN: [id number] DOB: 05/18/1991  08/03/2020  Mr. Shawn Mcknight was observed post Covid-19 immunization for 30 minutes based on pre-vaccination screening without incident. He was provided with Vaccine Information Sheet and instruction to access the V-Safe system.   Mr. Shawn Mcknight was instructed to call 911 with any severe reactions post vaccine: Marland Kitchen Difficulty breathing  . Swelling of face and throat  . A fast heartbeat  . A bad rash all over body  . Dizziness and weakness

## 2020-08-03 NOTE — Progress Notes (Signed)
   Covid-19 Vaccination Clinic  Name:  Shawn Mcknight    MRN: [id number] DOB: 13-Sep-1990  08/03/2020  [name] was observed post Covid-19 immunization for 30 minutes based on pre-vaccination screening without incident. He was provided with Vaccine Information Sheet and instruction to access the V-Safe system.   [name] was instructed to call 911 with any severe reactions post vaccine: Marland Kitchen Difficulty breathing  . Swelling of face and throat  . A fast heartbeat  . A bad rash all over body  . Dizziness and weakness   Immunizations Administered    Name Date Dose VIS Date Route   Pfizer COVID-19 Vaccine 08/03/2020 11:37 AM 0.3 mL 05/15/2020 Intramuscular   Manufacturer: Gas   Lot: [id number]   NDC: [address]-1

## 2020-08-13 ENCOUNTER — Ambulatory Visit: Payer: 59 | Admitting: Dietician

## 2020-11-12 ENCOUNTER — Ambulatory Visit: Payer: 59 | Admitting: Family Medicine

## 2020-11-13 ENCOUNTER — Ambulatory Visit (INDEPENDENT_AMBULATORY_CARE_PROVIDER_SITE_OTHER): Payer: 59 | Admitting: Family Medicine

## 2020-11-13 ENCOUNTER — Encounter: Payer: Self-pay | Admitting: Family Medicine

## 2020-11-13 ENCOUNTER — Other Ambulatory Visit: Payer: Self-pay

## 2020-11-13 DIAGNOSIS — L02211 Cutaneous abscess of abdominal wall: Secondary | ICD-10-CM | POA: Diagnosis not present

## 2020-11-13 DIAGNOSIS — N50811 Right testicular pain: Secondary | ICD-10-CM

## 2020-11-13 DIAGNOSIS — N50812 Left testicular pain: Secondary | ICD-10-CM

## 2020-11-13 DIAGNOSIS — N50819 Testicular pain, unspecified: Secondary | ICD-10-CM | POA: Insufficient documentation

## 2020-11-13 MED ORDER — DOXYCYCLINE HYCLATE 100 MG PO TABS: 100.0000 mg | ORAL_TABLET | Freq: Two times a day (BID) | ORAL | 0 refills | Status: DC

## 2020-11-13 MED ORDER — MUPIROCIN 2 % EX OINT: 1.0000 "application " | TOPICAL_OINTMENT | Freq: Two times a day (BID) | CUTANEOUS | 0 refills | Status: AC

## 2020-11-13 NOTE — Progress Notes (Signed)
Shawn Mcknight - 30 y.o. male MRN [id number]  Date of birth: 03-21-1991  Subjective Chief Complaint  Patient presents with  . Mass  . Prostatitis    HPI Shawn Mcknight is a 30 y.o. male here today with complaint of sore on the abdominal wall.  He gets these occasionally.  Area is tender and swollen.  He denies fever, chills, drainage from these areas.   He also was seen at a clinic during recent visit to  [place] for testicular pain.  He had repeat ultrasound showing small epididymal cyst.  Was told that he may have prostatitis due to tenderness on rectal exam and given rx for cardura.  He has not started this yet.  He was not given antibiotics.   ROS:  A comprehensive ROS was completed and negative except as noted per HPI  Allergies  Allergen Reactions  . Shellfish Allergy Swelling    Lips swelling  . Lactose Intolerance (Gi) Other (See Comments)    Bloating, gas    Past Medical History:  Diagnosis Date  . Bradycardia   . Closed fracture of lateral portion of left tibial plateau 11/25/2019  . GERD (gastroesophageal reflux disease)   . IBS (irritable bowel syndrome)   . Lactose intolerance   . Obesity     Past Surgical History:  Procedure Laterality Date  . ORIF TIBIA PLATEAU Left 11/26/2019   Procedure: OPEN REDUCTION INTERNAL FIXATION (ORIF) TIBIAL PLATEAU;  Surgeon: Marchia Bond, MD;  Location: [facility];  Service: Orthopedics;  Laterality: Left;  . WISDOM TOOTH EXTRACTION      Social History   Socioeconomic History  . Marital status: Single    Spouse name: Not on file  . Number of children: Not on file  . Years of education: Not on file  . Highest education level: Not on file  Occupational History  . Not on file  Tobacco Use  . Smoking status: Never Smoker  . Smokeless tobacco: Never Used  Vaping Use  . Vaping Use: Never used  Substance and Sexual Activity  . Alcohol use: No  . Drug use: Never  . Sexual activity: Yes  Other Topics Concern  . Not on file   Social History Narrative  . Not on file   Social Determinants of Health   Financial Resource Strain: Not on file  Food Insecurity: Not on file  Transportation Needs: Not on file  Physical Activity: Not on file  Stress: Not on file  Social Connections: Not on file    Family History  Problem Relation Age of Onset  . Hypertension Mother   . Liver disease Mother        fatty liver  . Healthy Father   . Thyroid disease Sister   . Thyroid cancer Sister   . Esophageal cancer Maternal Grandmother   . Stomach cancer Maternal Grandmother   . Colon cancer Maternal Grandmother        [name] they removed some of the colon. Not sure if it small or large. but thinks it is the small  . Colon polyps Neg Hx   . Rectal cancer Neg Hx     Health Maintenance  Topic Date Due  . INFLUENZA VACCINE  02/24/2021  . TETANUS/TDAP  11/24/2029  . COVID-19 Vaccine  Completed  . Hepatitis C Screening  Completed  . HPV VACCINES  Aged Out  . HIV Screening  Discontinued     ----------------------------------------------------------------------------------------------------------------------------------------------------------------------------------------------------------------- Physical Exam BP 140/67 (BP Location: Left Arm, Patient Position: Sitting, Cuff Size:  Large)   Pulse 62   Temp 97.9 F (36.6 C)   Ht 5\' 5"  (1.651 m)   Wt 248 lb 3.2 oz (112.6 kg)   SpO2 97%   BMI 41.30 kg/m   Physical Exam Constitutional:      Appearance: Normal appearance.  Cardiovascular:     Rate and Rhythm: Normal rate and regular rhythm.  Pulmonary:     Effort: Pulmonary effort is normal.     Breath sounds: Normal breath sounds.  Musculoskeletal:     Cervical back: Neck supple.  Skin:    Comments: Indurated area along L lower abdominal wall.  No fluctuance noted.  Mild tenderness.   Neurological:     Mental Status: He is alert.  Psychiatric:        Mood and Affect: Mood normal.        Behavior: Behavior  normal.     ------------------------------------------------------------------------------------------------------------------------------------------------------------------------------------------------------------------- Assessment and Plan  Cutaneous abscess of abdominal wall Induration without fluctuance.  Will treat with doxycycline and mupirocin.  He will let me know if symptoms are not improving.   Testicular pain Recurrent issue, recent US in [place] with small epididymal cyst.  He denies obstructive symptoms at this time.    Meds ordered this encounter  Medications  . doxycycline (VIBRA-TABS) 100 MG tablet    Sig: Take 1 tablet (100 mg total) by mouth 2 (two) times daily.    Dispense:  20 tablet    Refill:  0  . mupirocin ointment (BACTROBAN) 2 %    Sig: Apply 1 application topically 2 (two) times daily for 7 days.    Dispense:  22 g    Refill:  0    No follow-ups on file.    This visit occurred during the SARS-CoV-2 public health emergency.  Safety protocols were in place, including screening questions prior to the visit, additional usage of staff PPE, and extensive cleaning of exam room while observing appropriate contact time as indicated for disinfecting solutions.

## 2020-11-13 NOTE — Assessment & Plan Note (Signed)
Induration without fluctuance.  Will treat with doxycycline and mupirocin.  He will let me know if symptoms are not improving.

## 2020-11-13 NOTE — Patient Instructions (Signed)
Start doxycycline and mupirocin.  Follow up if symptoms are worsening.  Schedule physical at your convenience.

## 2020-11-13 NOTE — Assessment & Plan Note (Signed)
Recurrent issue, recent US in [place] with small epididymal cyst.  He denies obstructive symptoms at this time.

## 2020-12-04 ENCOUNTER — Ambulatory Visit (INDEPENDENT_AMBULATORY_CARE_PROVIDER_SITE_OTHER): Payer: 59 | Admitting: Podiatry

## 2020-12-04 ENCOUNTER — Other Ambulatory Visit: Payer: Self-pay

## 2020-12-04 ENCOUNTER — Encounter: Payer: Self-pay | Admitting: Family Medicine

## 2020-12-04 ENCOUNTER — Ambulatory Visit (INDEPENDENT_AMBULATORY_CARE_PROVIDER_SITE_OTHER): Payer: 59 | Admitting: Family Medicine

## 2020-12-04 VITALS — BP 132/62 | HR 53 | Temp 97.9°F | Ht 66.14 in | Wt 248.7 lb

## 2020-12-04 DIAGNOSIS — Z113 Encounter for screening for infections with a predominantly sexual mode of transmission: Secondary | ICD-10-CM

## 2020-12-04 DIAGNOSIS — Z0000 Encounter for general adult medical examination without abnormal findings: Secondary | ICD-10-CM | POA: Diagnosis not present

## 2020-12-04 DIAGNOSIS — R7989 Other specified abnormal findings of blood chemistry: Secondary | ICD-10-CM

## 2020-12-04 DIAGNOSIS — K760 Fatty (change of) liver, not elsewhere classified: Secondary | ICD-10-CM

## 2020-12-04 DIAGNOSIS — Z114 Encounter for screening for human immunodeficiency virus [HIV]: Secondary | ICD-10-CM

## 2020-12-04 DIAGNOSIS — L600 Ingrowing nail: Secondary | ICD-10-CM

## 2020-12-04 DIAGNOSIS — Z13220 Encounter for screening for lipoid disorders: Secondary | ICD-10-CM | POA: Diagnosis not present

## 2020-12-04 DIAGNOSIS — Z5320 Procedure and treatment not carried out because of patient's decision for unspecified reasons: Secondary | ICD-10-CM

## 2020-12-04 NOTE — Assessment & Plan Note (Addendum)
Well adult Orders Placed This Encounter  Procedures  . Chlamydia/Neisseria Gonorrhoeae RNA,TMA,Urogenital  . COMPLETE METABOLIC PANEL WITH GFR  . CBC with Differential  . Lipid Panel w/reflex Direct LDL  . TSH + free T4  . HIV antibody (with reflex)  . RPR  Screening: STD screening, Lipid Immunizations: UTD Anticipatory guidance/Risk factor reduction:  Weight loss advised to help combat fatty liver disease.  Additional recommendations per AVS.

## 2020-12-04 NOTE — Patient Instructions (Signed)

## 2020-12-04 NOTE — Progress Notes (Signed)
Shawn Mcknight - 30 y.o. male MRN [id number]  Date of birth: Dec 31, 1990  Subjective No chief complaint on file.   HPI Shawn Mcknight is a 30 y.o. male here today for annual exam.  He reports that he is doing pretty well.   History of NAFLD. Elevated LFT's and [place] consistent with this.    Weight has been stable.  He is exercising a few days per week and working on making changes to his diet.   He would like STD screening.  Denies symptoms.   He is a non-smoker and denies EtOH use.   Review of Systems  Constitutional: Negative for chills, fever, malaise/fatigue and weight loss.  HENT: Negative for congestion, ear pain and sore throat.   Eyes: Negative for blurred vision, double vision and pain.  Respiratory: Negative for cough and shortness of breath.   Cardiovascular: Negative for chest pain and palpitations.  Gastrointestinal: Negative for abdominal pain, blood in stool, constipation, heartburn and nausea.  Genitourinary: Negative for dysuria and urgency.  Musculoskeletal: Negative for joint pain and myalgias.  Neurological: Negative for dizziness and headaches.  Endo/Heme/Allergies: Does not bruise/bleed easily.  Psychiatric/Behavioral: Negative for depression. The patient is not nervous/anxious and does not have insomnia.         Allergies  Allergen Reactions  . Shellfish Allergy Swelling    Lips swelling  . Lactose Intolerance (Gi) Other (See Comments)    Bloating, gas    Past Medical History:  Diagnosis Date  . Bradycardia   . Closed fracture of lateral portion of left tibial plateau 11/25/2019  . GERD (gastroesophageal reflux disease)   . IBS (irritable bowel syndrome)   . Lactose intolerance   . Obesity     Past Surgical History:  Procedure Laterality Date  . ORIF TIBIA PLATEAU Left 11/26/2019   Procedure: OPEN REDUCTION INTERNAL FIXATION (ORIF) TIBIAL PLATEAU;  Surgeon: Marchia Bond, MD;  Location: [place];  Service: Orthopedics;  Laterality: Left;  . WISDOM  TOOTH EXTRACTION      Social History   Socioeconomic History  . Marital status: Single    Spouse name: Not on file  . Number of children: Not on file  . Years of education: Not on file  . Highest education level: Not on file  Occupational History  . Not on file  Tobacco Use  . Smoking status: Never Smoker  . Smokeless tobacco: Never Used  Vaping Use  . Vaping Use: Never used  Substance and Sexual Activity  . Alcohol use: No  . Drug use: Never  . Sexual activity: Yes  Other Topics Concern  . Not on file  Social History Narrative  . Not on file   Social Determinants of Health   Financial Resource Strain: Not on file  Food Insecurity: Not on file  Transportation Needs: Not on file  Physical Activity: Not on file  Stress: Not on file  Social Connections: Not on file    Family History  Problem Relation Age of Onset  . Hypertension Mother   . Liver disease Mother        fatty liver  . Healthy Father   . Thyroid disease Sister   . Thyroid cancer Sister   . Esophageal cancer Maternal Grandmother   . Stomach cancer Maternal Grandmother   . Colon cancer Maternal Grandmother        [name] they removed some of the colon. Not sure if it small or large. but thinks it is the small  . Colon polyps  Neg Hx   . Rectal cancer Neg Hx     Health Maintenance  Topic Date Due  . INFLUENZA VACCINE  02/24/2021  . TETANUS/TDAP  11/24/2029  . COVID-19 Vaccine  Completed  . Hepatitis C Screening  Completed  . HPV VACCINES  Aged Out  . HIV Screening  Discontinued     ----------------------------------------------------------------------------------------------------------------------------------------------------------------------------------------------------------------- Physical Exam BP 132/62 (BP Location: Left Arm, Patient Position: Sitting, Cuff Size: Large)   Pulse (!) 53   Temp 97.9 F (36.6 C)   Ht 5' 6.14" (1.68 m)   Wt 248 lb 11.2 oz (112.8 kg)   SpO2 94%   BMI  39.97 kg/m   Physical Exam Constitutional:      General: He is not in acute distress. HENT:     Head: Normocephalic and atraumatic.     Right Ear: Tympanic membrane and external ear normal.     Left Ear: Tympanic membrane and external ear normal.  Eyes:     General: No scleral icterus. Neck:     Thyroid: No thyromegaly.  Cardiovascular:     Rate and Rhythm: Normal rate and regular rhythm.     Heart sounds: Normal heart sounds.  Pulmonary:     Effort: Pulmonary effort is normal.     Breath sounds: Normal breath sounds.  Abdominal:     General: Bowel sounds are normal. There is no distension.     Palpations: Abdomen is soft.     Tenderness: There is no abdominal tenderness. There is no guarding.  Musculoskeletal:     Cervical back: Normal range of motion.  Lymphadenopathy:     Cervical: No cervical adenopathy.  Skin:    General: Skin is warm and dry.     Findings: No rash.  Neurological:     Mental Status: He is alert and oriented to person, place, and time.     Cranial Nerves: No cranial nerve deficit.     Motor: No abnormal muscle tone.  Psychiatric:        Mood and Affect: Mood normal.        Behavior: Behavior normal.     ------------------------------------------------------------------------------------------------------------------------------------------------------------------------------------------------------------------- Assessment and Plan  Well adult exam Well adult Orders Placed This Encounter  Procedures  . Chlamydia/Neisseria Gonorrhoeae RNA,TMA,Urogenital  . COMPLETE METABOLIC PANEL WITH GFR  . CBC with Differential  . Lipid Panel w/reflex Direct LDL  . TSH + free T4  . HIV antibody (with reflex)  . RPR  Screening: STD screening, Lipid Immunizations: UTD Anticipatory guidance/Risk factor reduction:  Weight loss advised to help combat fatty liver disease.  Additional recommendations per AVS.     No orders of the defined types were placed in  this encounter.   No follow-ups on file.    This visit occurred during the SARS-CoV-2 public health emergency.  Safety protocols were in place, including screening questions prior to the visit, additional usage of staff PPE, and extensive cleaning of exam room while observing appropriate contact time as indicated for disinfecting solutions.

## 2020-12-04 NOTE — Patient Instructions (Signed)
Preventive Care 41-29 Years Old, Male Preventive care refers to lifestyle choices and visits with your health care provider that can promote health and wellness. This includes:  A yearly physical exam. This is also called an annual wellness visit.  Regular dental and eye exams.  Immunizations.  Screening for certain conditions.  Healthy lifestyle choices, such as: ? Eating a healthy diet. ? Getting regular exercise. ? Not using drugs or products that contain nicotine and tobacco. ? Limiting alcohol use. What can I expect for my preventive care visit? Physical exam Your health care provider may check your:  Height and weight. These may be used to calculate your BMI (body mass index). BMI is a measurement that tells if you are at a healthy weight.  Heart rate and blood pressure.  Body temperature.  Skin for abnormal spots. Counseling Your health care provider may ask you questions about your:  Past medical problems.  Family's medical history.  Alcohol, tobacco, and drug use.  Emotional well-being.  Home life and relationship well-being.  Sexual activity.  Diet, exercise, and sleep habits.  Work and work Statistician.  Access to firearms. What immunizations do I need? Vaccines are usually given at various ages, according to a schedule. Your health care provider will recommend vaccines for you based on your age, medical history, and lifestyle or other factors, such as travel or where you work.   What tests do I need? Blood tests  Lipid and cholesterol levels. These may be checked every 5 years starting at age 30.  Hepatitis C test.  Hepatitis B test. Screening  Diabetes screening. This is done by checking your blood sugar (glucose) after you have not eaten for a while (fasting).  Genital exam to check for testicular cancer or hernias.  STD (sexually transmitted disease) testing, if you are at risk. Talk with your health care provider about your test results,  treatment options, and if necessary, the need for more tests.   Follow these instructions at home: Eating and drinking  Eat a healthy diet that includes fresh fruits and vegetables, whole grains, lean protein, and low-fat dairy products.  Drink enough fluid to keep your urine pale yellow.  Take vitamin and mineral supplements as recommended by your health care provider.  Do not drink alcohol if your health care provider tells you not to drink.  If you drink alcohol: ? Limit how much you have to 0-2 drinks a day. ? Be aware of how much alcohol is in your drink. In the [place] one drink equals one 12 oz bottle of beer (355 mL), one 5 oz glass of wine (148 mL), or one 1 oz glass of hard liquor (44 mL).   Lifestyle  Take daily care of your teeth and gums. Brush your teeth every morning and night with fluoride toothpaste. Floss one time each day.  Stay active. Exercise for at least 30 minutes 5 or more days each week.  Do not use any products that contain nicotine or tobacco, such as cigarettes, e-cigarettes, and chewing tobacco. If you need help quitting, ask your health care provider.  Do not use drugs.  If you are sexually active, practice safe sex. Use a condom or other form of protection to prevent STIs (sexually transmitted infections).  Find healthy ways to cope with stress, such as: ? Meditation, yoga, or listening to music. ? Journaling. ? Talking to a trusted person. ? Spending time with friends and family. Safety  Always wear your seat belt while driving  or riding in a vehicle.  Do not drive: ? If you have been drinking alcohol. Do not ride with someone who has been drinking. ? When you are tired or distracted. ? While texting.  Wear a helmet and other protective equipment during sports activities.  If you have firearms in your house, make sure you follow all gun safety procedures.  Seek help if you have been physically or sexually abused. What's next?  Go to your  health care provider once a year for an annual wellness visit.  Ask your health care provider how often you should have your eyes and teeth checked.  Stay up to date on all vaccines. This information is not intended to replace advice given to you by your health care provider. Make sure you discuss any questions you have with your health care provider. Document Revised: 03/29/2019 Document Reviewed: 07/07/2018 Elsevier Patient Education  2021 [employer].

## 2020-12-06 ENCOUNTER — Encounter: Payer: Self-pay | Admitting: Podiatry

## 2020-12-06 LAB — CBC WITH DIFFERENTIAL/PLATELET
Absolute Monocytes: 428 cells/uL (ref 200–950)
Basophils Absolute: 48 cells/uL (ref 0–200)
Basophils Relative: 0.7 %
Eosinophils Absolute: 88 cells/uL (ref 15–500)
Eosinophils Relative: 1.3 %
HCT: 49.6 % (ref 38.5–50.0)
Hemoglobin: 16.5 g/dL (ref 13.2–17.1)
Lymphs Abs: 1952 cells/uL (ref 850–3900)
MCH: 28.1 pg (ref 27.0–33.0)
MCHC: 33.3 g/dL (ref 32.0–36.0)
MCV: 84.5 fL (ref 80.0–100.0)
MPV: 10.8 fL (ref 7.5–12.5)
Monocytes Relative: 6.3 %
Neutro Abs: 4284 cells/uL (ref 1500–7800)
Neutrophils Relative %: 63 %
Platelets: 270 10*3/uL (ref 140–400)
RBC: 5.87 10*6/uL — ABNORMAL HIGH (ref 4.20–5.80)
RDW: 13.1 % (ref 11.0–15.0)
Total Lymphocyte: 28.7 %
WBC: 6.8 10*3/uL (ref 3.8–10.8)

## 2020-12-06 LAB — COMPLETE METABOLIC PANEL WITH GFR
AG Ratio: 1.8 (calc) (ref 1.0–2.5)
ALT: 99 U/L — ABNORMAL HIGH (ref 9–46)
AST: 41 U/L — ABNORMAL HIGH (ref 10–40)
Albumin: 4.8 g/dL (ref 3.6–5.1)
Alkaline phosphatase (APISO): 93 U/L (ref 36–130)
BUN: 16 mg/dL (ref 7–25)
CO2: 27 mmol/L (ref 20–32)
Calcium: 9.9 mg/dL (ref 8.6–10.3)
Chloride: 103 mmol/L (ref 98–110)
Creat: 0.91 mg/dL (ref 0.60–1.35)
GFR, Est African American: 132 mL/min/{1.73_m2} (ref >=60)
GFR, Est Non African American: 113 mL/min/{1.73_m2} (ref >=60)
Globulin: 2.6 g/dL (calc) (ref 1.9–3.7)
Glucose, Bld: 95 mg/dL (ref 65–99)
Potassium: 4.3 mmol/L (ref 3.5–5.3)
Sodium: 140 mmol/L (ref 135–146)
Total Bilirubin: 0.7 mg/dL (ref 0.2–1.2)
Total Protein: 7.4 g/dL (ref 6.1–8.1)

## 2020-12-06 LAB — CHLAMYDIA/NEISSERIA GONORRHOEAE RNA,TMA,UROGENTIAL
C. trachomatis RNA, TMA: NOT DETECTED
N. gonorrhoeae RNA, TMA: NOT DETECTED

## 2020-12-06 LAB — TSH+FREE T4: TSH W/REFLEX TO FT4: 1.43 mIU/L (ref 0.40–4.50)

## 2020-12-06 LAB — RPR: RPR Ser Ql: NONREACTIVE

## 2020-12-06 LAB — HIV ANTIBODY (ROUTINE TESTING W REFLEX): HIV 1&2 Ab, 4th Generation: NONREACTIVE

## 2020-12-06 LAB — LIPID PANEL W/REFLEX DIRECT LDL
Cholesterol: 234 mg/dL — ABNORMAL HIGH (ref <200)
HDL: 39 mg/dL — ABNORMAL LOW (ref >=40)
LDL Cholesterol (Calc): 153 mg/dL (calc) — ABNORMAL HIGH
Non-HDL Cholesterol (Calc): 195 mg/dL (calc) — ABNORMAL HIGH (ref <130)
Total CHOL/HDL Ratio: 6 (calc) — ABNORMAL HIGH (ref <5.0)
Triglycerides: 256 mg/dL — ABNORMAL HIGH (ref <150)

## 2020-12-06 NOTE — Progress Notes (Signed)
Subjective:  Patient ID: Shawn Mcknight, male    DOB: 04-08-1991,  MRN: [id number]  No chief complaint on file.   30 y.o. male presents with the above complaint.  Patient presents with recurrent of left medial border ingrown.  Is painful to touch.  There is a small separate nail growth that may have not completely been eradicated.  Patient states is painful to touch especially after is going up long enough.  He would like to have it removed.  He denies any other acute complaints.   Review of Systems: Negative except as noted in the HPI. Denies N/V/F/Ch.  Past Medical History:  Diagnosis Date  . Bradycardia   . Closed fracture of lateral portion of left tibial plateau 11/25/2019  . GERD (gastroesophageal reflux disease)   . IBS (irritable bowel syndrome)   . Lactose intolerance   . Obesity     Current Outpatient Medications:  .  Inulin (FIBER CHOICE PO), Take 1 tablet by mouth 3 (three) times daily with meals., Disp: , Rfl:  .  omeprazole (PRILOSEC) 40 MG capsule, Take 1 capsule (40 mg total) by mouth 2 (two) times daily. (Patient taking differently: Take 40 mg by mouth daily.), Disp: 60 capsule, Rfl: 3 .  Propylene Glycol (SYSTANE COMPLETE) 0.6 % SOLN, Place 1 drop into both eyes daily., Disp: , Rfl:  .  simethicone (MYLICON) 427 MG chewable tablet, Chew 1 tablet (125 mg total) by mouth every 6 (six) hours as needed for flatulence (bloating)., Disp: 30 tablet, Rfl: 0  Social History   Tobacco Use  Smoking Status Never Smoker  Smokeless Tobacco Never Used    Allergies  Allergen Reactions  . Shellfish Allergy Swelling    Lips swelling  . Lactose Intolerance (Gi) Other (See Comments)    Bloating, gas   Objective:  There were no vitals filed for this visit. There is no height or weight on file to calculate BMI. Constitutional Well developed. Well nourished.  Vascular Dorsalis pedis pulses palpable bilaterally. Posterior tibial pulses palpable bilaterally. Capillary refill  normal to all digits.  No cyanosis or clubbing noted. Pedal hair growth normal.  Neurologic Normal speech. Oriented to person, place, and time. Epicritic sensation to light touch grossly present bilaterally.  Dermatologic Painful ingrowing nail at medial nail borders of the hallux nail left. No other open wounds. No skin lesions.  Orthopedic: Normal joint ROM without pain or crepitus bilaterally. No visible deformities. No bony tenderness.   Radiographs: None Assessment:   1. Ingrown left big toenail    Plan:  Patient was evaluated and treated and all questions answered.  Ingrown Nail, left -Patient elects to proceed with minor surgery to remove ingrown toenail removal today. Consent reviewed and signed by patient. -Ingrown nail excised. See procedure note. -Educated on post-procedure care including soaking. Written instructions provided and reviewed. -Patient to follow up in 2 weeks for nail check.  Procedure: Excision of Ingrown Toenail Location: Left 1st toe medial nail borders. Anesthesia: Lidocaine 1% plain; 1.5 mL and Marcaine 0.5% plain; 1.5 mL, digital block. Skin Prep: Betadine. Dressing: Silvadene; telfa; dry, sterile, compression dressing. Technique: Following skin prep, the toe was exsanguinated and a tourniquet was secured at the base of the toe. The affected nail border was freed, split with a nail splitter, and excised. Chemical matrixectomy was then performed with phenol and irrigated out with alcohol. The tourniquet was then removed and sterile dressing applied. Disposition: Patient tolerated procedure well. Patient to return in 2 weeks for follow-up.  No follow-ups on file.

## 2021-01-24 ENCOUNTER — Encounter: Payer: Self-pay | Admitting: Family Medicine

## 2021-02-22 ENCOUNTER — Ambulatory Visit (HOSPITAL_COMMUNITY)
Admission: EM | Admit: 2021-02-22 | Discharge: 2021-02-22 | Disposition: A | Discharge status: Home / Self Care | Payer: 59 | Attending: Physician Assistant | Admitting: Physician Assistant

## 2021-02-22 ENCOUNTER — Encounter (HOSPITAL_COMMUNITY): Payer: Self-pay

## 2021-02-22 ENCOUNTER — Other Ambulatory Visit: Payer: Self-pay

## 2021-02-22 DIAGNOSIS — H66001 Acute suppurative otitis media without spontaneous rupture of ear drum, right ear: Secondary | ICD-10-CM

## 2021-02-22 DIAGNOSIS — S86899A Other injury of other muscle(s) and tendon(s) at lower leg level, unspecified leg, initial encounter: Secondary | ICD-10-CM | POA: Diagnosis not present

## 2021-02-22 DIAGNOSIS — H9201 Otalgia, right ear: Secondary | ICD-10-CM | POA: Diagnosis not present

## 2021-02-22 MED ORDER — AMOXICILLIN-POT CLAVULANATE 875-125 MG PO TABS: 1.0000 | ORAL_TABLET | Freq: Two times a day (BID) | ORAL | 0 refills | Status: DC

## 2021-02-22 MED ORDER — NAPROXEN 375 MG PO TABS: 375.0000 mg | ORAL_TABLET | Freq: Two times a day (BID) | ORAL | 0 refills | Status: DC

## 2021-02-22 NOTE — ED Triage Notes (Signed)
Pt reports right ear pain, right sided jaw pain since last night. Pt reports he's being swimming in the past week.   Pt reports right shin pain x 3-4 weeks after he started working out.

## 2021-02-22 NOTE — ED Provider Notes (Signed)
[place]    CSN: [id number] Arrival date & time: 02/22/21  1048      History   Chief Complaint Chief Complaint  Patient presents with   Otalgia    HPI Shawn Mcknight is a 30 y.o. male.   Patient presents today with variety of complaints.  He reports several day history of right ear pain.  Reports pain is rated 4 on a 0-10 pain scale, localized to right ear with radiation throughout jaw, described as aching, no aggravating leaving factors identified.  Patient reports that he has been swimming more often but consistently uses earplugs.  He denies any pain with manipulation of external ear but does report that pain travels from his ear into his jaw as well as posteriorly.  Initially pain began with mastication at dinner and has been persistent since that time.  He has not tried any over-the-counter medication for symptom management.  Denies any fever, cough, congestion, otorrhea, changes in hearing.  Denies any recent antibiotics.  Has not seen an ENT in the past and denies history of recurrent ear infections.  In addition, patient reports a month-long history of intermittent lower leg pain following activity.  Reports that approximately 1 year ago he broke his left leg requiring ORIF and has been working with his orthopedic to slowly advance his activity level to lose weight.  He is started biking and swimming regularly but finds that he will have a sharp pain in his anterior tibia following activity.  He denies any changes to shoes or footwear.  He has not tried any over-the-counter medication for symptom management.  Reports symptoms improved with rest.   Past Medical History:  Diagnosis Date   Bradycardia    Closed fracture of lateral portion of left tibial plateau 11/25/2019   GERD (gastroesophageal reflux disease)    IBS (irritable bowel syndrome)    Lactose intolerance    Obesity     Patient Active Problem List   Diagnosis Date Noted   Well adult exam 12/04/2020    Cutaneous abscess of abdominal wall 11/13/2020   Testicular pain 11/13/2020   Closed fracture of lateral portion of left tibial plateau 11/25/2019   NAFLD (nonalcoholic fatty liver disease) 10/25/2019   Class 3 severe obesity due to excess calories with serious comorbidity and body mass index (BMI) of 40.0 to 44.9 in adult ([place]) 10/25/2019   Elevated LFTs 07/03/2019   Ankle sprain 07/03/2019    Past Surgical History:  Procedure Laterality Date   ORIF TIBIA PLATEAU Left 11/26/2019   Procedure: OPEN REDUCTION INTERNAL FIXATION (ORIF) TIBIAL PLATEAU;  Surgeon: Marchia Bond, MD;  Location: [place];  Service: Orthopedics;  Laterality: Left;   WISDOM TOOTH EXTRACTION         Home Medications    Prior to Admission medications   Medication Sig Start Date End Date Taking? Authorizing Provider  amoxicillin-clavulanate (AUGMENTIN) 875-125 MG tablet Take 1 tablet by mouth every 12 (twelve) hours. 02/22/21  Yes Etoy Mcdonnell K, PA-C  naproxen (NAPROSYN) 375 MG tablet Take 1 tablet (375 mg total) by mouth 2 (two) times daily. 02/22/21  Yes Beonka Amesquita K, PA-C  Inulin (FIBER CHOICE PO) Take 1 tablet by mouth 3 (three) times daily with meals.    [provider]  Propylene Glycol (SYSTANE COMPLETE) 0.6 % SOLN Place 1 drop into both eyes daily.    [provider]  simethicone (MYLICON) 0000000 MG chewable tablet Chew 1 tablet (125 mg total) by mouth every 6 (six)  hours as needed for flatulence (bloating). 06/07/19   Zigmund Gottron, NP    Family History Family History  Problem Relation Age of Onset   Hypertension Mother    Liver disease Mother        fatty liver   Healthy Father    Thyroid disease Sister    Thyroid cancer Sister    Esophageal cancer Maternal Grandmother    Stomach cancer Maternal Grandmother    Colon cancer Maternal Grandmother        [name] they removed some of the colon. Not sure if it small or large. but thinks it is the small   Colon polyps Neg Hx    Rectal  cancer Neg Hx     Social History Social History   Tobacco Use   Smoking status: Never   Smokeless tobacco: Never  Vaping Use   Vaping Use: Never used  Substance Use Topics   Alcohol use: No   Drug use: Never     Allergies   Shellfish allergy and Lactose intolerance (gi)   Review of Systems Review of Systems  Constitutional:  Negative for activity change, appetite change, fatigue and fever.  HENT:  Positive for ear discharge. Negative for congestion, sinus pressure, sneezing and sore throat.   Respiratory:  Negative for cough and shortness of breath.   Cardiovascular:  Negative for chest pain.  Gastrointestinal:  Negative for abdominal pain, diarrhea, nausea and vomiting.  Musculoskeletal:  Positive for arthralgias and myalgias.  Neurological:  Negative for dizziness, light-headedness and headaches.    Physical Exam Triage Vital Signs ED Triage Vitals  Enc Vitals Group     BP 02/22/21 1243 125/67     Pulse Rate 02/22/21 1243 63     Resp 02/22/21 1243 18     Temp 02/22/21 1243 98.4 F (36.9 C)     Temp Source 02/22/21 1243 Oral     SpO2 02/22/21 1243 97 %     Weight --      Height --      Head Circumference --      Peak Flow --      Pain Score 02/22/21 1241 3     Pain Loc --      Pain Edu? --      Excl. in [place]? --    No data found.  Updated Vital Signs BP 125/67 (BP Location: Right Arm)   Pulse 63   Temp 98.4 F (36.9 C) (Oral)   Resp 18   SpO2 97%   Visual Acuity Right Eye Distance:   Left Eye Distance:   Bilateral Distance:    Right Eye Near:   Left Eye Near:    Bilateral Near:     Physical Exam Vitals reviewed.  Constitutional:      General: He is awake.     Appearance: Normal appearance. He is normal weight. He is not ill-appearing.     Comments: Very pleasant male appears stated age no acute distress  HENT:     Head: Normocephalic and atraumatic.     Jaw: No tenderness, pain on movement or malocclusion.     Right Ear: Ear canal and  external ear normal. Tympanic membrane is erythematous and bulging.     Left Ear: Tympanic membrane, ear canal and external ear normal. Tympanic membrane is not erythematous or bulging.     Nose: Nose normal.     Mouth/Throat:     Pharynx: Uvula midline. No oropharyngeal exudate or posterior oropharyngeal erythema.  Cardiovascular:     Rate and Rhythm: Normal rate and regular rhythm.     Heart sounds: Normal heart sounds, S1 normal and S2 normal. No murmur heard. Pulmonary:     Effort: Pulmonary effort is normal. No accessory muscle usage or respiratory distress.     Breath sounds: Normal breath sounds. No stridor. No wheezing, rhonchi or rales.     Comments: Clear auscultation bilaterally Abdominal:     General: Bowel sounds are normal.     Palpations: Abdomen is soft.     Tenderness: There is no abdominal tenderness.  Musculoskeletal:     Right lower leg: Tenderness present. No bony tenderness.     Left lower leg: Tenderness present. No bony tenderness.  Neurological:     Mental Status: He is alert.  Psychiatric:        Behavior: Behavior is cooperative.     UC Treatments / Results  Labs (all labs ordered are listed, but only abnormal results are displayed) Labs Reviewed - No data to display  EKG   Radiology No results found.  Procedures Procedures (including critical care time)  Medications Ordered in UC Medications - No data to display  Initial Impression / Assessment and Plan / UC Course  I have reviewed the triage vital signs and the nursing notes.  Pertinent labs & imaging results that were available during my care of the patient were reviewed by me and considered in my medical decision making (see chart for details).      Otitis media identified on physical exam.  Patient was started on Augmentin.  Recommended he use over-the-counter medications for additional symptom relief.  Recommended that he follow-up with primary care provider within 1 week to ensure  resolution of infection.  Discussed alarm symptoms that warrant emergent evaluation.  Strict return precautions given to which patient expressed understanding.  Discussed conservative treatment measures to treat medial tibial stress syndrome.  Patient was prescribed Naprosyn with instruction to take additional NSAIDs due to risk of GI bleeding.  Discussed that if symptoms or not improving he should follow-up with sports medicine provider.  Discussed alarm symptoms that warrant emergent evaluation.  Final Clinical Impressions(s) / UC Diagnoses   Final diagnoses:  Non-recurrent acute suppurative otitis media of right ear without spontaneous rupture of tympanic membrane  Right ear pain  Medial tibial stress syndrome, unspecified laterality, initial encounter     Discharge Instructions      Take Augmentin twice daily for 7 days to cover for ear infection.  Avoid submerging her head in water while on this medication.  You can use over-the-counter medications for pain relief.  If you have any worsening symptoms please return for reevaluation.  I would recommend that you be reevaluated by your primary care provider within a few weeks to ensure resolution of infection.  I do recommend that you sleep with your to be level as tolerated to avoid shinsplints.  Make sure that you are wearing adequate and supportive footwear.  You can use Naprosyn to help with pain.  Take this twice daily as needed.  You should not take additional NSAIDs including aspirin, ibuprofen/Advil/Motrin, naproxen/Aleve as it can cause stomach bleeding when taken with Naprosyn.  You can take Tylenol for breakthrough pain.  Use elevation and ice after work out for additional symptom relief.  If symptoms persist please follow-up with sports medicine provider.     ED Prescriptions     Medication Sig Dispense Auth. Provider   amoxicillin-clavulanate (AUGMENTIN) 875-125 MG  tablet Take 1 tablet by mouth every 12 (twelve) hours. 14  tablet Sears Oran K, PA-C   naproxen (NAPROSYN) 375 MG tablet Take 1 tablet (375 mg total) by mouth 2 (two) times daily. 20 tablet Tamaira Ciriello, Derry Skill, PA-C      PDMP not reviewed this encounter.   Terrilee Croak, PA-C 02/22/21 1320

## 2021-02-22 NOTE — Discharge Instructions (Addendum)
Take Augmentin twice daily for 7 days to cover for ear infection.  Avoid submerging her head in water while on this medication.  You can use over-the-counter medications for pain relief.  If you have any worsening symptoms please return for reevaluation.  I would recommend that you be reevaluated by your primary care provider within a few weeks to ensure resolution of infection.  I do recommend that you sleep with your to be level as tolerated to avoid shinsplints.  Make sure that you are wearing adequate and supportive footwear.  You can use Naprosyn to help with pain.  Take this twice daily as needed.  You should not take additional NSAIDs including aspirin, ibuprofen/Advil/Motrin, naproxen/Aleve as it can cause stomach bleeding when taken with Naprosyn.  You can take Tylenol for breakthrough pain.  Use elevation and ice after work out for additional symptom relief.  If symptoms persist please follow-up with sports medicine provider.

## 2021-03-04 ENCOUNTER — Ambulatory Visit: Payer: 59 | Admitting: Family Medicine

## 2021-03-06 ENCOUNTER — Ambulatory Visit (INDEPENDENT_AMBULATORY_CARE_PROVIDER_SITE_OTHER): Payer: 59 | Admitting: Family Medicine

## 2021-03-06 ENCOUNTER — Other Ambulatory Visit: Payer: Self-pay

## 2021-03-06 ENCOUNTER — Encounter: Payer: Self-pay | Admitting: Family Medicine

## 2021-03-06 DIAGNOSIS — R519 Headache, unspecified: Secondary | ICD-10-CM | POA: Diagnosis not present

## 2021-03-06 DIAGNOSIS — E785 Hyperlipidemia, unspecified: Secondary | ICD-10-CM | POA: Insufficient documentation

## 2021-03-06 DIAGNOSIS — E782 Mixed hyperlipidemia: Secondary | ICD-10-CM

## 2021-03-06 MED ORDER — MELOXICAM 7.5 MG PO TABS: 7.5000 mg | ORAL_TABLET | Freq: Two times a day (BID) | ORAL | 0 refills | Status: DC | PRN

## 2021-03-06 MED ORDER — ROSUVASTATIN CALCIUM 20 MG PO TABS: 20.0000 mg | ORAL_TABLET | Freq: Every day | ORAL | 3 refills | Status: DC

## 2021-03-06 NOTE — Assessment & Plan Note (Signed)
Pain along right side and posterior scalp.  Otitis media has resolved.  There is no lymphadenopathy present.  We will try adding meloxicam to see if this is helpful.

## 2021-03-06 NOTE — Progress Notes (Signed)
Shawn Mcknight - 30 y.o. male MRN [id number]  Date of birth: 12/22/1990  Subjective Chief Complaint  Patient presents with   Right ear pain     HPI Shawn Mcknight is a 30 year old male here today for follow-up of recent urgent care visit.  He was diagnosed with otitis media of the right ear at recent urgent care visit.  He was started on Augmentin and has completed course of this.  He reports that ear pain has improved but he continues to have some pain along the top of his scalp.  Does not recall any injury.  He denies any fever, chills.  In regards to the pain in his scalp this is located on the right parietal area and extends into the right side of the neck.  He denies any neck stiffness.  He did have elevated cholesterol on previous lab work.  Recommended he start rosuvastatin.  He would like to go ahead and start this.  ROS:  A comprehensive ROS was completed and negative except as noted per HPI  Allergies  Allergen Reactions   Shellfish Allergy Swelling    Lips swelling   Lactose Intolerance (Gi) Other (See Comments)    Bloating, gas    Past Medical History:  Diagnosis Date   Bradycardia    Closed fracture of lateral portion of left tibial plateau 11/25/2019   GERD (gastroesophageal reflux disease)    IBS (irritable bowel syndrome)    Lactose intolerance    Obesity     Past Surgical History:  Procedure Laterality Date   ORIF TIBIA PLATEAU Left 11/26/2019   Procedure: OPEN REDUCTION INTERNAL FIXATION (ORIF) TIBIAL PLATEAU;  Surgeon: Marchia Bond, MD;  Location: [facility];  Service: Orthopedics;  Laterality: Left;   WISDOM TOOTH EXTRACTION      Social History   Socioeconomic History   Marital status: Single    Spouse name: Not on file   Number of children: Not on file   Years of education: Not on file   Highest education level: Not on file  Occupational History   Not on file  Tobacco Use   Smoking status: Never   Smokeless tobacco: Never  Vaping Use   Vaping Use: Never used   Substance and Sexual Activity   Alcohol use: No   Drug use: Never   Sexual activity: Yes  Other Topics Concern   Not on file  Social History Narrative   Not on file   Social Determinants of Health   Financial Resource Strain: Not on file  Food Insecurity: Not on file  Transportation Needs: Not on file  Physical Activity: Not on file  Stress: Not on file  Social Connections: Not on file    Family History  Problem Relation Age of Onset   Hypertension Mother    Liver disease Mother        fatty liver   Healthy Father    Thyroid disease Sister    Thyroid cancer Sister    Esophageal cancer Maternal Grandmother    Stomach cancer Maternal Grandmother    Colon cancer Maternal Grandmother        [name] they removed some of the colon. Not sure if it small or large. but thinks it is the small   Colon polyps Neg Hx    Rectal cancer Neg Hx     Health Maintenance  Topic Date Due   INFLUENZA VACCINE  02/24/2021   TETANUS/TDAP  11/24/2029   COVID-19 Vaccine  Completed   Hepatitis C Screening  Completed   Pneumococcal Vaccine 23-68 Years old  Aged Out   HPV VACCINES  Aged Out   HIV Screening  Discontinued     ----------------------------------------------------------------------------------------------------------------------------------------------------------------------------------------------------------------- Physical Exam BP 120/72 (BP Location: Left Arm, Patient Position: Sitting, Cuff Size: Large)   Pulse 65   Temp 98.1 F (36.7 C)   Ht '5\' 5"'$  (1.651 m)   Wt 256 lb 1.4 oz (116.2 kg)   SpO2 96%   BMI 42.62 kg/m   Physical Exam Constitutional:      Appearance: Normal appearance.  HENT:     Head: Normocephalic and atraumatic.  Eyes:     General: No scleral icterus. Cardiovascular:     Rate and Rhythm: Normal rate and regular rhythm.  Pulmonary:     Effort: Pulmonary effort is normal.     Breath sounds: Normal breath sounds.  Musculoskeletal:     Cervical  back: Neck supple.  Neurological:     General: No focal deficit present.     Mental Status: He is alert.  Psychiatric:        Mood and Affect: Mood normal.        Behavior: Behavior normal.    ------------------------------------------------------------------------------------------------------------------------------------------------------------------------------------------------------------------- Assessment and Plan  Hyperlipidemia Starting rosuvastatin at 20 mg daily.  Continue to work on dietary and lifestyle change.  Recheck LFTs and cholesterol in 3 to 4 months.  Scalp pain Pain along right side and posterior scalp.  Otitis media has resolved.  There is no lymphadenopathy present.  We will try adding meloxicam to see if this is helpful.   Meds ordered this encounter  Medications   meloxicam (MOBIC) 7.5 MG tablet    Sig: Take 1 tablet (7.5 mg total) by mouth 2 (two) times daily as needed for pain.    Dispense:  30 tablet    Refill:  0   rosuvastatin (CRESTOR) 20 MG tablet    Sig: Take 1 tablet (20 mg total) by mouth daily.    Dispense:  90 tablet    Refill:  3    Return in about 4 months (around 07/06/2021) for HLD.    This visit occurred during the SARS-CoV-2 public health emergency.  Safety protocols were in place, including screening questions prior to the visit, additional usage of staff PPE, and extensive cleaning of exam room while observing appropriate contact time as indicated for disinfecting solutions.

## 2021-03-06 NOTE — Patient Instructions (Signed)
Ear looks great.  Try anti-inflammatory for pain along scalp.  Start rosuvastatin for cholesterol.  Ice shins after activity  Follow up in 4 months for cholesterol.

## 2021-03-06 NOTE — Assessment & Plan Note (Addendum)
Starting rosuvastatin at 20 mg daily.  Continue to work on dietary and lifestyle change.  Recheck LFTs and cholesterol in 3 to 4 months.

## 2021-03-18 ENCOUNTER — Other Ambulatory Visit: Payer: Self-pay | Admitting: Family Medicine

## 2021-06-23 ENCOUNTER — Ambulatory Visit: Payer: 59 | Attending: Internal Medicine

## 2021-06-23 ENCOUNTER — Other Ambulatory Visit (HOSPITAL_BASED_OUTPATIENT_CLINIC_OR_DEPARTMENT_OTHER): Payer: Self-pay

## 2021-06-23 ENCOUNTER — Other Ambulatory Visit: Payer: Self-pay

## 2021-06-23 DIAGNOSIS — Z23 Encounter for immunization: Secondary | ICD-10-CM

## 2021-06-23 MED ORDER — PFIZER COVID-19 VAC BIVALENT 30 MCG/0.3ML IM SUSP
INTRAMUSCULAR | 0 refills | Status: DC
  Filled 2021-06-23: qty 0.3, 1d supply, fill #0

## 2021-06-23 NOTE — Progress Notes (Signed)
   Covid-19 Vaccination Clinic  Name:  Shawn Mcknight    MRN: [id number] DOB: 04-18-1991  06/23/2021  [name] was observed post Covid-19 immunization for 15 minutes without incident. He was provided with Vaccine Information Sheet and instruction to access the V-Safe system.   [name] was instructed to call 911 with any severe reactions post vaccine: Difficulty breathing  Swelling of face and throat  A fast heartbeat  A bad rash all over body  Dizziness and weakness   Immunizations Administered     Name Date Dose VIS Date Route   Pfizer Covid-19 Vaccine Bivalent Booster 06/23/2021  9:56 AM 0.3 mL 03/26/2021 Intramuscular   Manufacturer: [place]   Lot: [id number]   [place]: [phone]

## 2021-07-01 ENCOUNTER — Other Ambulatory Visit: Payer: Self-pay

## 2021-07-01 ENCOUNTER — Ambulatory Visit (INDEPENDENT_AMBULATORY_CARE_PROVIDER_SITE_OTHER): Payer: 59 | Admitting: Family Medicine

## 2021-07-01 ENCOUNTER — Encounter: Payer: Self-pay | Admitting: Family Medicine

## 2021-07-01 VITALS — BP 128/54 | HR 59 | Temp 97.8°F | Ht 65.0 in | Wt 258.0 lb

## 2021-07-01 DIAGNOSIS — E782 Mixed hyperlipidemia: Secondary | ICD-10-CM | POA: Diagnosis not present

## 2021-07-01 DIAGNOSIS — R1030 Lower abdominal pain, unspecified: Secondary | ICD-10-CM | POA: Diagnosis not present

## 2021-07-01 DIAGNOSIS — K760 Fatty (change of) liver, not elsewhere classified: Secondary | ICD-10-CM | POA: Diagnosis not present

## 2021-07-01 LAB — COMPLETE METABOLIC PANEL WITH GFR
AG Ratio: 1.7 (calc) (ref 1.0–2.5)
ALT: 123 U/L — ABNORMAL HIGH (ref 9–46)
AST: 50 U/L — ABNORMAL HIGH (ref 10–40)
Albumin: 4.5 g/dL (ref 3.6–5.1)
Alkaline phosphatase (APISO): 92 U/L (ref 36–130)
BUN: 15 mg/dL (ref 7–25)
CO2: 30 mmol/L (ref 20–32)
Calcium: 9.7 mg/dL (ref 8.6–10.3)
Chloride: 104 mmol/L (ref 98–110)
Creat: 0.85 mg/dL (ref 0.60–1.24)
Globulin: 2.7 g/dL (calc) (ref 1.9–3.7)
Glucose, Bld: 95 mg/dL (ref 65–99)
Potassium: 4.6 mmol/L (ref 3.5–5.3)
Sodium: 140 mmol/L (ref 135–146)
Total Bilirubin: 0.6 mg/dL (ref 0.2–1.2)
Total Protein: 7.2 g/dL (ref 6.1–8.1)
eGFR: 121 mL/min/{1.73_m2} (ref >=60)

## 2021-07-01 LAB — LIPID PANEL W/REFLEX DIRECT LDL
Cholesterol: 172 mg/dL (ref <200)
HDL: 41 mg/dL (ref >=40)
LDL Cholesterol (Calc): 106 mg/dL (calc) — ABNORMAL HIGH
Non-HDL Cholesterol (Calc): 131 mg/dL (calc) — ABNORMAL HIGH (ref <130)
Total CHOL/HDL Ratio: 4.2 (calc) (ref <5.0)
Triglycerides: 136 mg/dL (ref <150)

## 2021-07-01 NOTE — Assessment & Plan Note (Signed)
Likely related to constipation.  I encouraged him to try a fiber supplement and increase his fluid intake.  If not improving with these measures he will let me know.

## 2021-07-01 NOTE — Progress Notes (Signed)
Shawn Mcknight - 30 y.o. male MRN [id number]  Date of birth: [birth year]  Subjective Chief Complaint  Patient presents with   Abdominal Pain    HPI Shawn Mcknight is a 30 year old male here today for follow-up of hyperlipidemia.  He reports he is doing well with rosuvastatin at current strength.  He denies myalgias with this.  He is due to have updated labs.  He has not really made any changes to his diet or exercise habits and his weight is up a couple pounds since last visit in August.  He does complain of some intermittent abdominal pain.  This is located across the lower abdomen.  He does admit to some constipation and having a bowel movement does seem to help with this.  He has not noted any blood in his stool, changes in stool caliber, nausea or decreased appetite.  ROS:  A comprehensive ROS was completed and negative except as noted per HPI  Allergies  Allergen Reactions   Shellfish Allergy Swelling    Lips swelling   Lactose Intolerance (Gi) Other (See Comments)    Bloating, gas    Past Medical History:  Diagnosis Date   Bradycardia    Closed fracture of lateral portion of left tibial plateau 11/25/2019   GERD (gastroesophageal reflux disease)    IBS (irritable bowel syndrome)    Lactose intolerance    Obesity     Past Surgical History:  Procedure Laterality Date   ORIF TIBIA PLATEAU Left 11/26/2019   Procedure: OPEN REDUCTION INTERNAL FIXATION (ORIF) TIBIAL PLATEAU;  Surgeon: Marchia Bond, MD;  Location: [facility];  Service: Orthopedics;  Laterality: Left;   WISDOM TOOTH EXTRACTION      Social History   Socioeconomic History   Marital status: Single    Spouse name: Not on file   Number of children: Not on file   Years of education: Not on file   Highest education level: Not on file  Occupational History   Not on file  Tobacco Use   Smoking status: Never   Smokeless tobacco: Never  Vaping Use   Vaping Use: Never used  Substance and Sexual Activity   Alcohol use: No    Drug use: Never   Sexual activity: Yes  Other Topics Concern   Not on file  Social History Narrative   Not on file   Social Determinants of Health   Financial Resource Strain: Not on file  Food Insecurity: Not on file  Transportation Needs: Not on file  Physical Activity: Not on file  Stress: Not on file  Social Connections: Not on file    Family History  Problem Relation Age of Onset   Hypertension Mother    Liver disease Mother        fatty liver   Healthy Father    Thyroid disease Sister    Thyroid cancer Sister    Esophageal cancer Maternal Grandmother    Stomach cancer Maternal Grandmother    Colon cancer Maternal Grandmother        [name] they removed some of the colon. Not sure if it small or large. but thinks it is the small   Colon polyps Neg Hx    Rectal cancer Neg Hx     Health Maintenance  Topic Date Due   INFLUENZA VACCINE  10/24/2021 (Originally 02/24/2021)   TETANUS/TDAP  11/24/2029   COVID-19 Vaccine  Completed   Hepatitis C Screening  Completed   Pneumococcal Vaccine 66-35 Years old  Aged Out   HPV  VACCINES  Aged Out   HIV Screening  Discontinued     ----------------------------------------------------------------------------------------------------------------------------------------------------------------------------------------------------------------- Physical Exam BP (!) 128/54 (BP Location: Left Arm, Patient Position: Sitting, Cuff Size: Large)   Pulse (!) 59   Temp 97.8 F (36.6 C)   Ht 5\' 5"  (1.651 m)   Wt 258 lb (117 kg)   SpO2 97%   BMI 42.93 kg/m   Physical Exam Constitutional:      Appearance: Normal appearance. He is well-developed.  Eyes:     General: No scleral icterus. Cardiovascular:     Rate and Rhythm: Normal rate and regular rhythm.  Pulmonary:     Effort: Pulmonary effort is normal.     Breath sounds: Normal breath sounds.  Abdominal:     General: There is no distension.     Palpations: Abdomen is soft.      Tenderness: There is no abdominal tenderness. There is no guarding.  Musculoskeletal:     Cervical back: Neck supple.  Skin:    General: Skin is warm and dry.  Neurological:     Mental Status: He is alert.  Psychiatric:        Mood and Affect: Mood normal.        Behavior: Behavior normal.    ------------------------------------------------------------------------------------------------------------------------------------------------------------------------------------------------------------------- Assessment and Plan  Hyperlipidemia Updating lipid panel and LFTs today.  Encouraged to work on dietary change for better control of cholesterol as well as fatty liver disease.  Lower abdominal pain Likely related to constipation.  I encouraged him to try a fiber supplement and increase his fluid intake.  If not improving with these measures he will let me know.   No orders of the defined types were placed in this encounter.   No follow-ups on file.    This visit occurred during the SARS-CoV-2 public health emergency.  Safety protocols were in place, including screening questions prior to the visit, additional usage of staff PPE, and extensive cleaning of exam room while observing appropriate contact time as indicated for disinfecting solutions.

## 2021-07-01 NOTE — Patient Instructions (Signed)
Try adding a fiber supplement daily.  Be sure to drink plenty of water each day.  We'll be in touch with lab results and recommendations.

## 2021-07-01 NOTE — Assessment & Plan Note (Signed)
Updating lipid panel and LFTs today.  Encouraged to work on dietary change for better control of cholesterol as well as fatty liver disease.

## 2021-12-01 ENCOUNTER — Ambulatory Visit (HOSPITAL_COMMUNITY)
Admission: RE | Admit: 2021-12-01 | Discharge: 2021-12-01 | Disposition: A | Discharge status: Home / Self Care | Payer: 59 | Source: Ambulatory Visit | Attending: Emergency Medicine | Admitting: Emergency Medicine

## 2021-12-01 ENCOUNTER — Encounter (HOSPITAL_COMMUNITY): Payer: Self-pay

## 2021-12-01 ENCOUNTER — Ambulatory Visit (INDEPENDENT_AMBULATORY_CARE_PROVIDER_SITE_OTHER): Payer: 59

## 2021-12-01 VITALS — BP 126/68 | HR 100 | Temp 98.9°F | Resp 20

## 2021-12-01 DIAGNOSIS — K5900 Constipation, unspecified: Secondary | ICD-10-CM

## 2021-12-01 DIAGNOSIS — R109 Unspecified abdominal pain: Secondary | ICD-10-CM | POA: Diagnosis not present

## 2021-12-01 DIAGNOSIS — K5904 Chronic idiopathic constipation: Secondary | ICD-10-CM | POA: Diagnosis not present

## 2021-12-01 MED ORDER — LINACLOTIDE 145 MCG PO CAPS: 145.0000 ug | ORAL_CAPSULE | Freq: Every day | ORAL | 0 refills | Status: DC

## 2021-12-01 NOTE — ED Triage Notes (Signed)
Pt reports two weeks ago when lifting luggage felt something tear in right abd side. While in [place] had abdominal ultrasound and has results with him. Pain is constant in abd area, back comes and goes.   ?Still having pain in right lower, right upper and right mid back pains. PCP cant be seen for a couple months.  ?Pt hasnt had constipation and having to take fiber. Reports stools can be very thin "like ribbons". Pt reports that has sensation of having to have a BM but can not go.  ?

## 2021-12-01 NOTE — Discharge Instructions (Signed)
The x-ray of your abdomen reveals a significant stool burden, particularly on the right where you are having the most discomfort. ? ?I recommend that you begin a medication called Linzess.  Please take this medication about 30 minutes before your first meal of the day starting tomorrow morning.  Please take it daily.  Please continue taking fiber supplement and making sure that you are drinking between 80 and 120 ounces of water every day. ? ?Please follow-up with your gastroenterologist for further evaluation of chronic constipation and to see if there are other strategies they might employ to help you with this issue which appears to have been going on for several years. ? ?Thank you for visiting urgent care today. ?

## 2021-12-02 ENCOUNTER — Telehealth: Payer: Self-pay | Admitting: Family Medicine

## 2021-12-02 NOTE — Telephone Encounter (Signed)
Pt sched new pt appointment via my chart ? ?Contact pt regarding symps. Pt states he visited urgent care and was prescribed meds  ?

## 2021-12-03 NOTE — ED Provider Notes (Addendum)
?[facility] ? ? ? ?CSN: [id number] ?Arrival date & time: 12/01/21  1538 ?  ? ?HISTORY  ? ?Chief Complaint  ?Patient presents with  ? Abdominal Pain  ?  Abdominal pain after lifting a heavy object, it's been two weeks and the pain is not decreasing. Pain in top right and bottom right abdomen, specifically when bending forward. - Entered by patient  ? Constipation  ? ?HPI ?Shawn Mcknight is a 31 y.o. male. Patient presents to urgent care complaining of pain in his right upper and lower quadrant of his abdomen.  Patient states the pain is constant, radiates to his back intermittently.  Patient states he tried to get in with his primary care provider but was unable to be seen.  Patient reports a history of constipation, states he takes fiber supplement every day.  Patient states lately his stools have been very thin coming out like "ribbons".  Patient states he has felt the urge to defecate for the past 2 weeks but has had very little production of stool.  Patient denies blood in stool, rectal pain, diarrhea. ? ?The history is provided by the patient.  ?Past Medical History:  ?Diagnosis Date  ? Bradycardia   ? Closed fracture of lateral portion of left tibial plateau 11/25/2019  ? GERD (gastroesophageal reflux disease)   ? IBS (irritable bowel syndrome)   ? Lactose intolerance   ? Obesity   ? ?Patient Active Problem List  ? Diagnosis Date Noted  ? Lower abdominal pain 07/01/2021  ? Hyperlipidemia 03/06/2021  ? Scalp pain 03/06/2021  ? Well adult exam 12/04/2020  ? Cutaneous abscess of abdominal wall 11/13/2020  ? Testicular pain 11/13/2020  ? Closed fracture of lateral portion of left tibial plateau 11/25/2019  ? NAFLD (nonalcoholic fatty liver disease) 10/25/2019  ? Class 3 severe obesity due to excess calories with serious comorbidity and body mass index (BMI) of 40.0 to 44.9 in adult [facility]) 10/25/2019  ? Elevated LFTs 07/03/2019  ? Ankle sprain 07/03/2019  ? ?Past Surgical History:  ?Procedure Laterality Date   ? ORIF TIBIA PLATEAU Left 11/26/2019  ? Procedure: OPEN REDUCTION INTERNAL FIXATION (ORIF) TIBIAL PLATEAU;  Surgeon: Marchia Bond, MD;  Location: [facility];  Service: Orthopedics;  Laterality: Left;  ? WISDOM TOOTH EXTRACTION    ? ? ?Home Medications   ? ?Prior to Admission medications   ?Medication Sig Start Date End Date Taking? Authorizing Provider  ?linaclotide Rolan Lipa) 145 MCG CAPS capsule Take 1 capsule (145 mcg total) by mouth daily before breakfast. 12/01/21  Yes Lynden Oxford Scales, PA-C  ?Inulin (FIBER CHOICE PO) Take 1 tablet by mouth 3 (three) times daily with meals.    [provider]  ?meloxicam (MOBIC) 7.5 MG tablet TAKE 1 TABLET(7.5 MG) BY MOUTH TWICE DAILY AS NEEDED FOR PAIN 03/18/21   [name], DO  ?rosuvastatin (CRESTOR) 20 MG tablet Take 1 tablet (20 mg total) by mouth daily. 03/06/21   [name], DO  ?simethicone (MYLICON) 660 MG chewable tablet Chew 1 tablet (125 mg total) by mouth every 6 (six) hours as needed for flatulence (bloating). 06/07/19   Zigmund Gottron, NP  ? ? ?Family History ?Family History  ?Problem Relation Age of Onset  ? Hypertension Mother   ? Liver disease Mother   ?     fatty liver  ? Healthy Father   ? Thyroid disease Sister   ? Thyroid cancer Sister   ? Esophageal cancer Maternal Grandmother   ? Stomach cancer Maternal  Grandmother   ? Colon cancer Maternal Grandmother   ?     Said they removed some of the colon. Not sure if it small or large. but thinks it is the small  ? Colon polyps Neg Hx   ? Rectal cancer Neg Hx   ? ?Social History ?Social History  ? ?Tobacco Use  ? Smoking status: Never  ? Smokeless tobacco: Never  ?Vaping Use  ? Vaping Use: Never used  ?Substance Use Topics  ? Alcohol use: No  ? Drug use: Never  ? ?Allergies   ?Shellfish allergy and Lactose intolerance (gi) ? ?Review of Systems ?Review of Systems ?Pertinent findings noted in history of present illness.  ? ?Physical Exam ?Triage Vital Signs ?ED Triage Vitals  ?Enc Vitals Group  ?    BP 05/23/21 0827 (!) 147/82  ?   Pulse Rate 05/23/21 0827 72  ?   Resp 05/23/21 0827 18  ?   Temp 05/23/21 0827 98.3 ?F (36.8 ?C)  ?   Temp Source 05/23/21 0827 Oral  ?   SpO2 05/23/21 0827 98 %  ?   Weight --   ?   Height --   ?   Head Circumference --   ?   Peak Flow --   ?   Pain Score 05/23/21 0826 5  ?   Pain Loc --   ?   Pain Edu? --   ?   Excl. in [place]? --   ?No data found. ? ?Updated Vital Signs ?BP 126/68 (BP Location: Right Arm)   Pulse 100   Temp 98.9 ?F (37.2 ?C) (Oral)   Resp 20   SpO2 100%  ? ?Physical Exam ?Vitals and nursing note reviewed.  ?Constitutional:   ?   General: He is not in acute distress. ?   Appearance: Normal appearance. He is not ill-appearing.  ?HENT:  ?   Head: Normocephalic and atraumatic.  ?Eyes:  ?   General: Lids are normal.     ?   Right eye: No discharge.     ?   Left eye: No discharge.  ?   Extraocular Movements: Extraocular movements intact.  ?   Conjunctiva/sclera: Conjunctivae normal.  ?   Right eye: Right conjunctiva is not injected.  ?   Left eye: Left conjunctiva is not injected.  ?Neck:  ?   Trachea: Trachea and phonation normal.  ?Cardiovascular:  ?   Rate and Rhythm: Normal rate and regular rhythm.  ?   Pulses: Normal pulses.  ?   Heart sounds: Normal heart sounds. No murmur heard. ?  No friction rub. No gallop.  ?Pulmonary:  ?   Effort: Pulmonary effort is normal. No accessory muscle usage, prolonged expiration or respiratory distress.  ?   Breath sounds: Normal breath sounds. No stridor, decreased air movement or transmitted upper airway sounds. No decreased breath sounds, wheezing, rhonchi or rales.  ?Chest:  ?   Chest wall: No tenderness.  ?Abdominal:  ?   General: Abdomen is flat. Bowel sounds are normal. There is no distension.  ?   Palpations: Abdomen is soft.  ?   Tenderness: There is generalized abdominal tenderness and tenderness in the right upper quadrant and right lower quadrant. There is no right CVA tenderness or left CVA tenderness.  ?   Hernia: No  hernia is present.  ?Musculoskeletal:     ?   General: Normal range of motion.  ?   Cervical back: Normal range of motion and neck  supple. Normal range of motion.  ?Lymphadenopathy:  ?   Cervical: No cervical adenopathy.  ?Skin: ?   General: Skin is warm and dry.  ?   Findings: No erythema or rash.  ?Neurological:  ?   General: No focal deficit present.  ?   Mental Status: He is alert and oriented to person, place, and time.  ?Psychiatric:     ?   Mood and Affect: Mood normal.     ?   Behavior: Behavior normal.  ? ? ?Visual Acuity ?Right Eye Distance:   ?Left Eye Distance:   ?Bilateral Distance:   ? ?Right Eye Near:   ?Left Eye Near:    ?Bilateral Near:    ? ?UC Couse / Diagnostics / Procedures:  ?  ?EKG ? ?Radiology ?DG Abd Acute W/Chest ? ?Result Date: 12/01/2021 ?CLINICAL DATA:  Right-sided abdominal pain, history of constipation felt tear in right abdomen after lifting luggage 2 weeks ago EXAM: DG ABDOMEN ACUTE WITH 1 VIEW CHEST COMPARISON:  None Available. FINDINGS: Chest: The cardiomediastinal silhouette is normal. The lungs clear, with no focal consolidation or pulmonary edema. There is no pleural effusion or pneumothorax. Abdomen: There is a nonobstructive bowel gas pattern. There is a mild stool burden in the right colon. There is no free intraperitoneal air. There is no gross organomegaly or abnormal soft tissue calcification. There is no acute osseous abnormality. IMPRESSION: Mild stool burden in the right hemiabdomen. Otherwise, unremarkable chest and abdominal radiographs. Electronically Signed   By: Valetta Mole M.D.   On: 12/01/2021 16:43   ? ?Procedures ?Procedures (including critical care time) ? ?UC Diagnoses / Final Clinical Impressions(s)   ?I have reviewed the triage vital signs and the nursing notes. ? ?Pertinent labs & imaging results that were available during my care of the patient were reviewed by me and considered in my medical decision making (see chart for details).   ? ?Final diagnoses:   ?Chronic idiopathic constipation  ? ?Mild stool burden appreciated on physical exam as well as on abdominal x-ray.  Patient advised to begin Linzess and continue fiber supplement.  Patient advised to foll

## 2021-12-09 ENCOUNTER — Ambulatory Visit: Payer: 59 | Admitting: Family Medicine

## 2021-12-09 ENCOUNTER — Encounter: Payer: Self-pay | Admitting: Family Medicine

## 2021-12-09 VITALS — BP 110/71 | HR 72 | Temp 99.5°F | Ht 65.5 in | Wt 257.5 lb

## 2021-12-09 DIAGNOSIS — K5900 Constipation, unspecified: Secondary | ICD-10-CM | POA: Diagnosis not present

## 2021-12-09 DIAGNOSIS — L989 Disorder of the skin and subcutaneous tissue, unspecified: Secondary | ICD-10-CM | POA: Diagnosis not present

## 2021-12-09 DIAGNOSIS — L608 Other nail disorders: Secondary | ICD-10-CM

## 2021-12-09 DIAGNOSIS — R109 Unspecified abdominal pain: Secondary | ICD-10-CM

## 2021-12-09 MED ORDER — ROSUVASTATIN CALCIUM 20 MG PO TABS: 20.0000 mg | ORAL_TABLET | Freq: Every day | ORAL | 3 refills | Status: DC

## 2021-12-09 NOTE — Patient Instructions (Addendum)
Keep the diet clean and stay active. ? ?Take Metamucil or Benefiber daily. ? ?The skin lesion can be monitored on your chest if it has been there for <15 months and is not changing. ? ?Don't worry about the nail unless something changes.  ? ?Let us know if you need anything. ?

## 2021-12-09 NOTE — Progress Notes (Signed)
Chief Complaint  ?Patient presents with  ? New Patient (Initial Visit)  ?  Abdominal pain on the right side ?Check nails ?Moles ?  ? ? ?Subjective: ?Patient is a 31 y.o. male here for TOC. ? ?3 weeks ago was lifting luggage and felt a pull on R abd. [place] was neg. XR 2 weeks later was neg. No bruising, swelling, redness, N/V/bleeding, unexplained bowel changes, unexplained weight loss. Not getting better.  He would like to make sure it is not a hernia.  It is most bothersome when he twists.  He is in the process of losing weight and wants to make sure it is safe to swim and/or cycle. ? ?Patient has been dealing with constipation.  He was prescribed Linzess at an urgent care.  Whenever he takes it, he has diarrhea.  He was taking a fiber tablet 3 times daily with minimal relief.  He tries to stay well-hydrated.  Diet is improving. ? ?Patient had an injury to his right thumb.  He has noticed intermittent pain in addition to a black line on the center of the nail.  He notices it is not part of the nail bed but just the nail.  No redness or drainage. ? ?The patient has had a dark spot on his left breast for the last 15 years at least.  It has not changed in size or color.  He was seen at the urgent care and was told to have that checked out.  There is no pain, itching, or drainage.  He has not put anything on it as it does not bother him. ? ?Past Medical History:  ?Diagnosis Date  ? Bradycardia   ? Closed fracture of lateral portion of left tibial plateau 11/25/2019  ? GERD (gastroesophageal reflux disease)   ? IBS (irritable bowel syndrome)   ? Lactose intolerance   ? Obesity   ? ? ?Objective: ?BP 110/71   Pulse 72   Temp 99.5 ?F (37.5 ?C) (Oral)   Ht 5' 5.5" (1.664 m)   Wt 257 lb 8 oz (116.8 kg)   SpO2 97%   BMI 42.20 kg/m?  ?General: Awake, appears stated age ?Abdomen: No tenderness to palpation, negative Murphy's, Rovsing's, McBurney's, Carnett's ?Skin: Over the left portion of the chest, there is an irregularly  shaped hyperpigmented lesion measuring approximately 0.7 x 0.4 cm.  It is not raised and there is no associated erythema, warmth, or TTP.  There is no fluctuance or drainage. ?On the right thumbnail, there is a linear hyperpigmented line centrally located over the nail plate.  Trying to separate the nailbed from the plate, there is no hyperpigmentation of the soft tissue, it is just on the nail.  There is no TTP over the nail plate with pressure ?MSK: +TTP over the right oblique just distal to the lower chest wall and in line with the iliac crest on the right.  I do not appreciate any edema, excessive warmth, fluctuance, or erythema ?Heart: RRR, no LE edema ?Lungs: CTAB, no rales, wheezes or rhonchi. No accessory muscle use ?Psych: Age appropriate judgment and insight, normal affect and mood ? ?Assessment and Plan: ?Abdominal pain, unspecified abdominal location ? ?Constipation, unspecified constipation type ? ?Longitudinal melanonychia ? ?Skin lesion of chest wall ? ?Seems to be a abdominal wall/oblique issue.  Ice, heat, Tylenol.  Stretch the area gently.  Avoid aggravating activities.  Likely swimming would be less feasible than cycling at this point.  Activity as tolerated though.  Physical therapy if  no improvement the next week or so.  He will send a MyChart message to update me. ?Stop Linzess as it is causing loose stools.  Start a daily fiber supplement with either Metamucil or Benefiber.  Stay hydrated. ?Reassurance.  Low concern for melanoma as it is strictly on the nail. ?Reassurance for now.  He does not wish to do anything such as see a dermatologist or have a biopsy.  It has not changed in over 15 years. He will let me know if it does start to change.  ?F/u in 2 mo for CPE.  ?The patient voiced understanding and agreement to the plan. ? ?I spent 45 min w the patient discussing the above plan in addition to reviewing his chart, he is a new patient to me, on the same day of the visit.  ? ?Shelda Pal, DO ?12/09/21  ?4:46 PM ? ? ? ? ?

## 2021-12-17 ENCOUNTER — Encounter: Payer: Self-pay | Admitting: Family Medicine

## 2021-12-17 ENCOUNTER — Other Ambulatory Visit: Payer: Self-pay | Admitting: Family Medicine

## 2021-12-17 DIAGNOSIS — R109 Unspecified abdominal pain: Secondary | ICD-10-CM

## 2022-01-01 NOTE — Therapy (Signed)
OUTPATIENT PHYSICAL THERAPY THORACOLUMBAR EVALUATION   Patient Name: Shawn Mcknight MRN: [id number] DOB:11/16/1990, 31 y.o., male Today's Date: 01/01/2022    Past Medical History:  Diagnosis Date   Closed fracture of lateral portion of left tibial plateau 11/25/2019   GERD (gastroesophageal reflux disease)    IBS (irritable bowel syndrome)    Lactose intolerance    Past Surgical History:  Procedure Laterality Date   ORIF TIBIA PLATEAU Left 11/26/2019   Procedure: OPEN REDUCTION INTERNAL FIXATION (ORIF) TIBIAL PLATEAU;  Surgeon: Marchia Bond, MD;  Location: [place];  Service: Orthopedics;  Laterality: Left;   WISDOM TOOTH EXTRACTION     Patient Active Problem List   Diagnosis Date Noted   Lower abdominal pain 07/01/2021   Hyperlipidemia 03/06/2021   Scalp pain 03/06/2021   Well adult exam 12/04/2020   Cutaneous abscess of abdominal wall 11/13/2020   Testicular pain 11/13/2020   Closed fracture of lateral portion of left tibial plateau 11/25/2019   NAFLD (nonalcoholic fatty liver disease) 10/25/2019   Class 3 severe obesity due to excess calories with serious comorbidity and body mass index (BMI) of 40.0 to 44.9 in adult [facility]) 10/25/2019   Elevated LFTs 07/03/2019   Ankle sprain 07/03/2019    PCP: Shelda Pal, DO  REFERRING PROVIDER: Shelda Pal, DO  REFERRING DIAG: R10.9 (ICD-10-CM) - Abdominal wall pain  RATIONALE FOR EVALUATION AND TREATMENT: Rehabilitation  THERAPY DIAG:  No diagnosis found.  ONSET DATE: ***  SUBJECTIVE:                                                                                                                                                                                           SUBJECTIVE STATEMENT: ***  PERTINENT HISTORY:  ***  PAIN:  Are you having pain? Yes: {yespain:27235::"NPRS scale: ***/10","Pain location: ***","Pain description: ***","Aggravating factors: ***","Relieving factors: ***"}   PRECAUTIONS:  {Therapy precautions:24002}  WEIGHT BEARING RESTRICTIONS {Yes ***/No:24003}  FALLS:  Has patient fallen in last 6 months? {fallsyesno:27318}  LIVING ENVIRONMENT: Lives with: {[facility] lives with:25569::"lives with their family"} Lives in: {Lives in:25570} Stairs: {opstairs:27293} Has following equipment at home: {Assistive devices:23999}  OCCUPATION: ***  PLOF: {PLOF:24004}  PATIENT GOALS ***   OBJECTIVE:   DIAGNOSTIC FINDINGS:  12/01/21 - Abdominal & chest x-rays: Mild stool burden in the right hemiabdomen. Otherwise, unremarkable chest and abdominal radiographs.  PATIENT SURVEYS:  {rehab surveys:24030}  SCREENING FOR RED FLAGS: Bowel or bladder incontinence: {Yes/No:304960894} Spinal tumors: {Yes/No:304960894} Cauda equina syndrome: {Yes/No:304960894} Compression fracture: {Yes/No:304960894} Abdominal aneurysm: {Yes/No:304960894}  COGNITION:  Overall cognitive status: {cognition:24006}     SENSATION: {sensation:27233}  MUSCLE LENGTH: Hamstrings: Right *** deg; Left ***  deg Marcello Moores test: Right *** deg; Left *** deg  POSTURE: {posture:25561}  PALPATION: ***  LUMBAR ROM:   {AROM/PROM:27142}  A/PROM  eval  Flexion   Extension   Right lateral flexion   Left lateral flexion   Right rotation   Left rotation    (Blank rows = not tested)  LOWER EXTREMITY ROM:     {AROM/PROM:27142}  Right eval Left eval  Hip flexion    Hip extension    Hip abduction    Hip adduction    Hip internal rotation    Hip external rotation    Knee flexion    Knee extension    Ankle dorsiflexion    Ankle plantarflexion    Ankle inversion    Ankle eversion     (Blank rows = not tested)  LOWER EXTREMITY MMT:    MMT Right eval Left eval  Hip flexion    Hip extension    Hip abduction    Hip adduction    Hip internal rotation    Hip external rotation    Knee flexion    Knee extension    Ankle dorsiflexion    Ankle plantarflexion    Ankle inversion    Ankle eversion      (Blank rows = not tested)  LUMBAR SPECIAL TESTS:  {lumbar special test:25242}  FUNCTIONAL TESTS:  {Functional tests:24029}  GAIT: Distance walked: *** Assistive device utilized: {Assistive devices:23999} Level of assistance: {Levels of assistance:24026} Comments: ***    TODAY'S TREATMENT  ***   PATIENT EDUCATION:  Education details: *** Person educated: {Person educated:25204} Education method: {Education Method:25205} Education comprehension: {Education Comprehension:25206}   HOME EXERCISE PROGRAM: ***  ASSESSMENT:  CLINICAL IMPRESSION: Patient is a *** y.o. *** who was seen today for physical therapy evaluation and treatment for ***.    OBJECTIVE IMPAIRMENTS {opptimpairments:25111}.   ACTIVITY LIMITATIONS {activitylimitations:27494}  PARTICIPATION LIMITATIONS: {participationrestrictions:25113}  PERSONAL FACTORS {Personal factors:25162} are also affecting patient's functional outcome.   REHAB POTENTIAL: {rehabpotential:25112}  CLINICAL DECISION MAKING: {clinical decision making:25114}  EVALUATION COMPLEXITY: {Evaluation complexity:25115}   GOALS: Goals reviewed with patient? {yes/no:20286}  SHORT TERM GOALS: Target date: {follow up:25551}  *** Baseline: Goal status: {GOALSTATUS:25110}  2.  *** Baseline:  Goal status: {GOALSTATUS:25110}  3.  *** Baseline:  Goal status: {GOALSTATUS:25110}  4.  *** Baseline:  Goal status: {GOALSTATUS:25110}  5.  *** Baseline:  Goal status: {GOALSTATUS:25110}  6.  *** Baseline:  Goal status: {GOALSTATUS:25110}  LONG TERM GOALS: Target date: {follow up:25551}  *** Baseline:  Goal status: {GOALSTATUS:25110}  2.  *** Baseline:  Goal status: {GOALSTATUS:25110}  3.  *** Baseline:  Goal status: {GOALSTATUS:25110}  4.  *** Baseline:  Goal status: {GOALSTATUS:25110}  5.  *** Baseline:  Goal status: {GOALSTATUS:25110}  6.  *** Baseline:  Goal status: {GOALSTATUS:25110}   PLAN: PT  FREQUENCY: {rehab frequency:25116}  PT DURATION: {rehab duration:25117}  PLANNED INTERVENTIONS: {rehab planned interventions:25118::"Therapeutic exercises","Therapeutic activity","Neuromuscular re-education","Balance training","Gait training","Patient/Family education","Joint mobilization"}.  PLAN FOR NEXT SESSION: ***   Percival Spanish, PT 01/01/2022, 4:49 PM

## 2022-01-02 ENCOUNTER — Encounter: Payer: Self-pay | Admitting: Family Medicine

## 2022-01-05 ENCOUNTER — Encounter: Payer: Self-pay | Admitting: Physical Therapy

## 2022-01-05 ENCOUNTER — Other Ambulatory Visit: Payer: Self-pay

## 2022-01-05 ENCOUNTER — Ambulatory Visit: Payer: 59 | Attending: Family Medicine | Admitting: Physical Therapy

## 2022-01-05 DIAGNOSIS — R252 Cramp and spasm: Secondary | ICD-10-CM | POA: Insufficient documentation

## 2022-01-05 DIAGNOSIS — R109 Unspecified abdominal pain: Secondary | ICD-10-CM | POA: Insufficient documentation

## 2022-01-05 DIAGNOSIS — R29898 Other symptoms and signs involving the musculoskeletal system: Secondary | ICD-10-CM | POA: Diagnosis present

## 2022-01-07 ENCOUNTER — Ambulatory Visit: Payer: 59

## 2022-01-07 DIAGNOSIS — R29898 Other symptoms and signs involving the musculoskeletal system: Secondary | ICD-10-CM | POA: Diagnosis not present

## 2022-01-07 DIAGNOSIS — R252 Cramp and spasm: Secondary | ICD-10-CM

## 2022-01-07 NOTE — Therapy (Signed)
OUTPATIENT PHYSICAL THERAPY TREATMENT   Patient Name: Shawn Mcknight MRN: [id number] DOB:[birth year], 31 y.o., male Today's Date: 01/07/2022   PT End of Session - 01/07/22 0929     Visit Number 2    Date for PT Re-Evaluation 02/16/22    Authorization Type [employer]    PT Start Time 0930    PT Stop Time 1019    PT Time Calculation (min) 49 min    Activity Tolerance Patient tolerated treatment well    Behavior During Therapy [facility] for tasks assessed/performed              Past Medical History:  Diagnosis Date   Closed fracture of lateral portion of left tibial plateau 11/25/2019   GERD (gastroesophageal reflux disease)    IBS (irritable bowel syndrome)    Lactose intolerance    Past Surgical History:  Procedure Laterality Date   ORIF TIBIA PLATEAU Left 11/26/2019   Procedure: OPEN REDUCTION INTERNAL FIXATION (ORIF) TIBIAL PLATEAU;  Surgeon: Marchia Bond, MD;  Location: [place];  Service: Orthopedics;  Laterality: Left;   WISDOM TOOTH EXTRACTION     Patient Active Problem List   Diagnosis Date Noted   Lower abdominal pain 07/01/2021   Hyperlipidemia 03/06/2021   Scalp pain 03/06/2021   Well adult exam 12/04/2020   Cutaneous abscess of abdominal wall 11/13/2020   Testicular pain 11/13/2020   Closed fracture of lateral portion of left tibial plateau 11/25/2019   NAFLD (nonalcoholic fatty liver disease) 10/25/2019   Class 3 severe obesity due to excess calories with serious comorbidity and body mass index (BMI) of 40.0 to 44.9 in adult ([place]) 10/25/2019   Elevated LFTs 07/03/2019   Ankle sprain 07/03/2019    PCP: Shelda Pal, DO  REFERRING PROVIDER: Shelda Pal, DO  REFERRING DIAG: R10.9 (ICD-10-CM) - Abdominal wall pain  RATIONALE FOR EVALUATION AND TREATMENT: Rehabilitation  THERAPY DIAG:  Other symptoms and signs involving the musculoskeletal system  Cramp and spasm  ONSET DATE: ~2 months  SUBJECTIVE:                                                                                                                                                                                            SUBJECTIVE STATEMENT: Pt reports exercises are going good, reports 4/10 pain in R lower abdomen when BLT.  PAIN:  Are you having pain? 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 Otherwise, unremarkable chest and abdominal radiographs.  SCREENING FOR RED FLAGS: Bowel or bladder incontinence: No Spinal tumors: No Cauda equina syndrome: No Compression fracture: No Abdominal aneurysm: No  COGNITION:  Overall cognitive status: Within functional limits for tasks assessed     SENSATION: WFL  MUSCLE LENGTH: Hamstrings: mild tight B ITB: mild tight B Piriformis: mild/mod tight B Hip flexors: mild tight L > R Quads: mild tight B  POSTURE:  increased lumbar lordosis and anterior pelvic tilt  PALPATION: TTP over R lower obliques  LUMBAR ROM:   Active  A/PROM  eval  Flexion WFL  Extension WFL *  Right lateral flexion WFL  Left lateral  flexion WFL  Right rotation WFL  Left rotation WFL *   * - "pulling" noted  LOWER EXTREMITY MMT:    MMT Right eval Left eval  Hip flexion 4 4  Hip extension 4 4+  Hip abduction 4- 4  Hip adduction 4 4+  Hip internal rotation 4+ 4  Hip external rotation 4 4  Knee flexion 5 4  Knee extension 5 4+5  Ankle dorsiflexion 5 5  Ankle plantarflexion 5 5  Ankle inversion    Ankle eversion     (Blank rows = not tested)    TODAY'S TREATMENT  01/07/22 Therapeutic Exercise: Nustep L4x70mn LTR 3x10" each side Supine TrA brace 10x5" Supine march with TrA brace GTB 10x3" Supine alt knee taps with GTB 10x3" Supine hip ADD ball squeeze with TrA brace 10x3" Supine 90/90 with BLE, unilateral lowering to mat, then repeat 10x S/L open book 3x10" bil Standing shoulder extension with lat pull bar 10#  20x Cybex row 25# 20 reps Standing deadlift with 6# x 10 - pt noted mild pain radiation towards naval  Standing trunk rotation with GTB 10x bil Standing pallof press with GTB 20x bil  01/05/22 THERAPEUTIC EXERCISE: Instruction in initial HEP to improve flexibility, strength and mobility (see exercises below).  Verbal and tactile cues throughout for technique.   PATIENT EDUCATION:  Education details: PT eval findings, anticipated POC, and initial HEP Person educated: Patient Education method: Explanation, Demonstration, and [place] emailed to patient Education comprehension: verbalized understanding, returned demonstration, verbal cues required, tactile cues required, and needs further education   HOME EXERCISE PROGRAM: Access Code: [email or web address]   ASSESSMENT:  CLINICAL IMPRESSION: Progressed HEP to include exercises to promote increased core activation with functional activities. Also reviewed gym exercises for posture and UE strength. Pt had c/o mild R lower abdominal pain lat pulls and deadlifts, however not limiting his ability to complete exercises. Educated on the importance  of core activation with lifting and general movement to reduce risk for injury. Pt would be able to keep progressing core exercises.   OBJECTIVE IMPAIRMENTS decreased activity tolerance, decreased knowledge of condition, decreased mobility, decreased strength, increased fascial restrictions, impaired  perceived functional ability, increased muscle spasms, impaired flexibility, improper body mechanics, postural dysfunction, and pain.   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   REHAB POTENTIAL: Excellent  CLINICAL DECISION MAKING: Stable/uncomplicated  EVALUATION COMPLEXITY: Low   GOALS: Goals reviewed with patient? Yes  SHORT TERM GOALS: Target date: 01/26/2022  Patient will be independent with initial HEP. Baseline: Goal status: IN PROGRESS  LONG TERM GOALS: Target date: 02/16/2022  Patient will be independent with ongoing/advanced HEP +/- gym program for self-management at home. Baseline:  Goal status: IN PROGRESS  2.  Improve understanding of body mechanics and spine care with patient to verbalize and demo correct lifting techniques. Baseline:  Goal status: IN PROGRESS  3.  Patient to report reduction in frequency and intensity of R abdominal wall pain by >/= 50-75% to allow for improved activity tolerance. Baseline:  Goal status: IN PROGRESS  4.  Patient to improve lumbar AROM to WNL without pain provocation. Baseline:  Goal status: IN PROGRESS  5.  Patient will demo strong contraction of TrA to stabilize pelvis during core work. Baseline:  Goal status: IN PROGRESS  6.  Patient will demonstrate improved B  proximal LE strength to >/= 4+/5 for improved stability and ease of mobility . Baseline:  Goal status: IN PROGRESS  7.  Patient to report ability to perform ADLs, household, and leisure activities without limitation due to R abdominal wall pain or weakness. Baseline:  Goal status: IN PROGRESS   PLAN: PT FREQUENCY: 1-2x/week  PT DURATION: 6 weeks  PLANNED INTERVENTIONS: Therapeutic exercises, Therapeutic activity, Neuromuscular re-education, Balance training, Gait training, Patient/Family education, Joint mobilization, Dry Needling, Electrical stimulation, Cryotherapy, Moist heat, Ultrasound, Ionotophoresis '4mg'$ /ml Dexamethasone, Manual therapy, and Re-evaluation.  PLAN FOR NEXT SESSION: Review HEP if needed; clarify appropriate gym-based machines/exercises; progress core/proximal LE strengthening; MT +/- DN to address abnormal muscle tension as indicated    Artist Pais, PTA 01/07/2022, 10:47 AM

## 2022-01-12 ENCOUNTER — Ambulatory Visit: Payer: 59 | Admitting: Physical Therapy

## 2022-01-12 ENCOUNTER — Encounter: Payer: Self-pay | Admitting: Physical Therapy

## 2022-01-12 DIAGNOSIS — R29898 Other symptoms and signs involving the musculoskeletal system: Secondary | ICD-10-CM

## 2022-01-12 DIAGNOSIS — R252 Cramp and spasm: Secondary | ICD-10-CM

## 2022-01-12 NOTE — Therapy (Signed)
OUTPATIENT PHYSICAL THERAPY TREATMENT   Patient Name: Shawn Mcknight MRN: [id number] DOB:Mar 21, 1991, 31 y.o., male Today's Date: 01/12/2022   PT End of Session - 01/12/22 0848     Visit Number 3    Date for PT Re-Evaluation 02/16/22    Authorization Type [employer]    PT Start Time 0848    PT Stop Time 0933    PT Time Calculation (min) 45 min    Activity Tolerance Patient tolerated treatment well    Behavior During Therapy [facility] for tasks assessed/performed               Past Medical History:  Diagnosis Date   Closed fracture of lateral portion of left tibial plateau 11/25/2019   GERD (gastroesophageal reflux disease)    IBS (irritable bowel syndrome)    Lactose intolerance    Past Surgical History:  Procedure Laterality Date   ORIF TIBIA PLATEAU Left 11/26/2019   Procedure: OPEN REDUCTION INTERNAL FIXATION (ORIF) TIBIAL PLATEAU;  Surgeon: Marchia Bond, MD;  Location: [facility];  Service: Orthopedics;  Laterality: Left;   WISDOM TOOTH EXTRACTION     Patient Active Problem List   Diagnosis Date Noted   Lower abdominal pain 07/01/2021   Hyperlipidemia 03/06/2021   Scalp pain 03/06/2021   Well adult exam 12/04/2020   Cutaneous abscess of abdominal wall 11/13/2020   Testicular pain 11/13/2020   Closed fracture of lateral portion of left tibial plateau 11/25/2019   NAFLD (nonalcoholic fatty liver disease) 10/25/2019   Class 3 severe obesity due to excess calories with serious comorbidity and body mass index (BMI) of 40.0 to 44.9 in adult ([place]) 10/25/2019   Elevated LFTs 07/03/2019   Ankle sprain 07/03/2019    PCP: Shelda Pal, DO  REFERRING PROVIDER: Shelda Pal, DO  REFERRING DIAG: R10.9 (ICD-10-CM) - Abdominal wall pain  RATIONALE FOR EVALUATION AND TREATMENT: Rehabilitation  THERAPY DIAG:  Other symptoms and signs involving the musculoskeletal system  Cramp and spasm  ONSET DATE: ~2 months  SUBJECTIVE:                                                                                                                                                                                            SUBJECTIVE STATEMENT: Pt reports pain remains intermittent, most common with bending down and reaching forward such as when reaching to the back of a low cabinet.  PAIN:  Are you having pain?  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 Otherwise, unremarkable chest and abdominal radiographs.  SCREENING FOR RED FLAGS: Bowel or bladder incontinence: No Spinal tumors: No Cauda equina syndrome: No Compression fracture: No Abdominal aneurysm: No  COGNITION:  Overall cognitive status: Within functional limits for tasks assessed     SENSATION: WFL  MUSCLE LENGTH: Hamstrings: mild tight B ITB: mild tight B Piriformis: mild/mod tight B Hip flexors: mild tight L > R Quads: mild tight B  POSTURE:  increased lumbar lordosis and anterior pelvic tilt  PALPATION: TTP over R lower obliques  LUMBAR ROM:   Active  A/PROM  eval  Flexion WFL  Extension WFL *  Right lateral flexion WFL  Left lateral flexion WFL  Right rotation WFL  Left rotation WFL *   * - "pulling" noted  LOWER EXTREMITY MMT:    MMT  Right eval Left eval  Hip flexion 4 4  Hip extension 4 4+  Hip abduction 4- 4  Hip adduction 4 4+  Hip internal rotation 4+ 4  Hip external rotation 4 4  Knee flexion 5 4  Knee extension 5 4+5  Ankle dorsiflexion 5 5  Ankle plantarflexion 5 5  Ankle inversion    Ankle eversion     (Blank rows = not tested)    TODAY'S TREATMENT   01/12/22 THERAPEUTIC EXERCISE: to improve flexibility, strength and mobility.  Verbal and tactile cues throughout for technique. NuStep L5 x 6 min Hooklying 90/90 sequential march and lower x 10 Hooklying 90/90 LTR x 5 Hooklying TrA + alt knee extension with eccentric SLR x 10 Quadruped bird dog x 5 (discontinued d/t pt uncomfortable with quadruped WBing on L knee) Plank elbows to toes + alt hip extension x 5 Standing TrA + GTB row 2 x 10 Standing TrA + GTB scap retraction/shoulder extension 2 x 10 Standing hip hinge with dowel along spine x 10 Standing GTB R/L pallof press x 20 Standing GTB R/L pallof press + trunk rotation x 10   01/07/22 Therapeutic Exercise: Nustep L4x69mn LTR 3x10" each side Supine TrA brace 10x5" Supine march with TrA brace GTB 10x3" Supine alt knee taps with GTB 10x3" Supine hip ADD ball squeeze with TrA brace 10x3" Supine 90/90 with BLE, unilateral lowering to mat, then repeat 10x S/L open book 3x10" bil Standing shoulder extension with lat pull bar 10#  20x Cybex row 25# 20 reps Standing deadlift with 6# x 10 - pt noted mild pain radiation towards naval  Standing trunk rotation with GTB 10x bil Standing pallof press with GTB 20x bil   01/05/22 THERAPEUTIC EXERCISE: Instruction in initial HEP to improve flexibility, strength and mobility (see exercises below).  Verbal and tactile cues throughout for technique.   PATIENT EDUCATION:  Education details: HEP progression  Person educated: Patient Education method: Explanation, Demonstration, and [place] emailed to patient Education comprehension: verbalized  understanding, returned demonstration, verbal cues required, and needs further education  HOME EXERCISE PROGRAM: Access Code: [email or web address] (Pt using MedBridge Go app)  Exercises - Supine Lower Trunk Rotation  - 2 x daily - 7 x weekly - 1-2 sets - 5 reps - 10 sec hold - Sidelying Thoracic Lumbar Rotation  - 2 x daily - 7 x weekly - 1-2 sets - 5 reps - 10 sec hold - Supine March with Resistance Band  - 2 x daily - 7 x weekly - 1-2 sets - 10 reps - 3 sec hold - Hooklying Sequential Leg March and Lower  - 1 x daily - 7 x weekly - 2 sets - 10 reps - 3 sec hold - Supine Single Leg Extensions  - 1 x daily - 7 x weekly - 2 sets - 10 reps - 3 sec hold - Standing Trunk Rotation with Resistance  - 1 x daily - 7 x weekly - 1-2 sets - 10 reps - Standing Bilateral Low Shoulder Row with Anchored Resistance  - 1 x daily - 7 x weekly - 2 sets - 10 reps - 5 sec hold - Scapular Retraction with Resistance Advanced  - 1 x daily - 7 x weekly - 2 sets - 10 reps - 5 sec hold    ASSESSMENT:  CLINICAL IMPRESSION: Macy reports HEP going well and denies need for full review but limited control noted with 90/90 lift and release, therefore transitioned to sequential march. Attempted quadruped core strengthening but pt reporting hesitancy/limited tolerance for WBing through anterior L knee d/t prior surgery and residual hardware in L knee. Plank position attempted as alternative but pt with limited control. Continued progression of TrA and oblique strengthening with good tolerance and no pain/discomfort noted.  OBJECTIVE IMPAIRMENTS decreased activity tolerance, decreased knowledge of condition, decreased mobility, decreased strength, increased fascial restrictions, impaired perceived functional ability, increased muscle spasms, impaired flexibility, improper body mechanics, postural dysfunction, and pain.   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   REHAB POTENTIAL: Excellent  CLINICAL DECISION MAKING: Stable/uncomplicated  EVALUATION COMPLEXITY: Low   GOALS: Goals reviewed with patient? Yes  SHORT TERM GOALS: Target date: 01/26/2022  Patient will be independent with initial HEP. Baseline: Goal status: IN PROGRESS  LONG TERM GOALS: Target date: 02/16/2022  Patient will be independent with ongoing/advanced HEP +/- gym program for self-management at home. Baseline:  Goal status: IN PROGRESS  2.  Improve understanding of body mechanics and spine care with patient to verbalize and demo correct lifting techniques. Baseline:  Goal status: IN PROGRESS  3.  Patient to report reduction in frequency and intensity of R abdominal wall pain by >/= 50-75% to allow for improved activity tolerance. Baseline:  Goal status: IN PROGRESS  4.  Patient to improve lumbar AROM to WNL without pain provocation. Baseline:  Goal status: IN PROGRESS  5.  Patient will demo strong contraction of TrA to stabilize pelvis during core work. Baseline:  Goal status: IN PROGRESS  6.  Patient will demonstrate improved B proximal LE strength to >/= 4+/5 for improved stability and ease of mobility . Baseline:  Goal status: IN PROGRESS  7.  Patient to report ability to perform ADLs, household, and leisure activities without limitation due to R abdominal wall pain or weakness.  Baseline:  Goal status: IN PROGRESS   PLAN: PT FREQUENCY: 1-2x/week  PT DURATION: 6 weeks  PLANNED INTERVENTIONS: Therapeutic exercises, Therapeutic activity, Neuromuscular re-education, Balance training, Gait training, Patient/Family education, Joint mobilization, Dry Needling,  Electrical stimulation, Cryotherapy, Moist heat, Ultrasound, Ionotophoresis '4mg'$ /ml Dexamethasone, Manual therapy, and Re-evaluation.  PLAN FOR NEXT SESSION: Review HEP if needed; clarify appropriate gym-based machines/exercises; progress core/proximal LE strengthening; MT +/- DN to address abnormal muscle tension as indicated    Percival Spanish, PT 01/12/2022, 10:49 AM

## 2022-01-14 ENCOUNTER — Encounter: Payer: Self-pay | Admitting: Family Medicine

## 2022-01-14 ENCOUNTER — Ambulatory Visit: Payer: 59 | Admitting: Family Medicine

## 2022-01-14 VITALS — BP 120/80 | HR 65 | Temp 98.4°F | Ht 65.0 in | Wt 265.1 lb

## 2022-01-14 DIAGNOSIS — H539 Unspecified visual disturbance: Secondary | ICD-10-CM

## 2022-01-14 DIAGNOSIS — G4452 New daily persistent headache (NDPH): Secondary | ICD-10-CM

## 2022-01-14 NOTE — Patient Instructions (Signed)
Someone will reach out regarding your MRI in the next few days. If you don't hear anything, please let us know.   Consider magnesium 200 mg daily for the headaches.   Ice/cold pack over area for 10-15 min twice daily.  Heat (pad or rice pillow in microwave) over affected area, 10-15 minutes twice daily.   OK to take Tylenol 1000 mg (2 extra strength tabs) or 975 mg (3 regular strength tabs) every 6 hours as needed.  Let us know if you need anything.  EXERCISES RANGE OF MOTION (ROM) AND STRETCHING EXERCISES  These exercises may help you when beginning to rehabilitate your issue. In order to successfully resolve your symptoms, you must improve your posture. These exercises are designed to help reduce the forward-head and rounded-shoulder posture which contributes to this condition. Your symptoms may resolve with or without further involvement from your physician, physical therapist or athletic trainer. While completing these exercises, remember:  Restoring tissue flexibility helps normal motion to return to the joints. This allows healthier, less painful movement and activity. An effective stretch should be held for at least 20 seconds, although you may need to begin with shorter hold times for comfort. A stretch should never be painful. You should only feel a gentle lengthening or release in the stretched tissue. Do not do any stretch or exercise that you cannot tolerate.  STRETCH- Axial Extensors Lie on your back on the floor. You may bend your knees for comfort. Place a rolled-up hand towel or dish towel, about 2 inches in diameter, under the part of your head that makes contact with the floor. Gently tuck your chin, as if trying to make a "double chin," until you feel a gentle stretch at the base of your head. Hold 15-20 seconds. Repeat 2-3 times. Complete this exercise 1 time per day.   STRETCH - Axial Extension  Stand or sit on a firm surface. Assume a good posture: chest up, shoulders  drawn back, abdominal muscles slightly tense, knees unlocked (if standing) and feet hip width apart. Slowly retract your chin so your head slides back and your chin slightly lowers. Continue to look straight ahead. You should feel a gentle stretch in the back of your head. Be certain not to feel an aggressive stretch since this can cause headaches later. Hold for 15-20 seconds. Repeat 2-3 times. Complete this exercise 1 time per day.  STRETCH - Cervical Side Bend  Stand or sit on a firm surface. Assume a good posture: chest up, shoulders drawn back, abdominal muscles slightly tense, knees unlocked (if standing) and feet hip width apart. Without letting your nose or shoulders move, slowly tip your right / left ear to your shoulder until your feel a gentle stretch in the muscles on the opposite side of your neck. Hold 15-20 seconds. Repeat 2-3 times. Complete this exercise 1-2 times per day.  STRETCH - Cervical Rotators  Stand or sit on a firm surface. Assume a good posture: chest up, shoulders drawn back, abdominal muscles slightly tense, knees unlocked (if standing) and feet hip width apart. Keeping your eyes level with the ground, slowly turn your head until you feel a gentle stretch along the back and opposite side of your neck. Hold 15-20 seconds. Repeat 2-3 times. Complete this exercise 1-2 times per day.  RANGE OF MOTION - Neck Circles  Stand or sit on a firm surface. Assume a good posture: chest up, shoulders drawn back, abdominal muscles slightly tense, knees unlocked (if standing) and feet  hip width apart. Gently roll your head down and around from the back of one shoulder to the back of the other. The motion should never be forced or painful. Repeat the motion 10-20 times, or until you feel the neck muscles relax and loosen. Repeat 2-3 times. Complete the exercise 1-2 times per day. STRENGTHENING EXERCISES - Cervical Strain and Sprain These exercises may help you when beginning to  rehabilitate your injury. They may resolve your symptoms with or without further involvement from your physician, physical therapist, or athletic trainer. While completing these exercises, remember:  Muscles can gain both the endurance and the strength needed for everyday activities through controlled exercises. Complete these exercises as instructed by your physician, physical therapist, or athletic trainer. Progress the resistance and repetitions only as guided. You may experience muscle soreness or fatigue, but the pain or discomfort you are trying to eliminate should never worsen during these exercises. If this pain does worsen, stop and make certain you are following the directions exactly. If the pain is still present after adjustments, discontinue the exercise until you can discuss the trouble with your clinician.  STRENGTH - Cervical Flexors, Isometric Face a wall, standing about 6 inches away. Place a small pillow, a ball about 6-8 inches in diameter, or a folded towel between your forehead and the wall. Slightly tuck your chin and gently push your forehead into the soft object. Push only with mild to moderate intensity, building up tension gradually. Keep your jaw and forehead relaxed. Hold 10 to 20 seconds. Keep your breathing relaxed. Release the tension slowly. Relax your neck muscles completely before you start the next repetition. Repeat 2-3 times. Complete this exercise 1 time per day.  STRENGTH- Cervical Lateral Flexors, Isometric  Stand about 6 inches away from a wall. Place a small pillow, a ball about 6-8 inches in diameter, or a folded towel between the side of your head and the wall. Slightly tuck your chin and gently tilt your head into the soft object. Push only with mild to moderate intensity, building up tension gradually. Keep your jaw and forehead relaxed. Hold 10 to 20 seconds. Keep your breathing relaxed. Release the tension slowly. Relax your neck muscles completely  before you start the next repetition. Repeat 2-3 times. Complete this exercise 1 time per day.  STRENGTH - Cervical Extensors, Isometric  Stand about 6 inches away from a wall. Place a small pillow, a ball about 6-8 inches in diameter, or a folded towel between the back of your head and the wall. Slightly tuck your chin and gently tilt your head back into the soft object. Push only with mild to moderate intensity, building up tension gradually. Keep your jaw and forehead relaxed. Hold 10 to 20 seconds. Keep your breathing relaxed. Release the tension slowly. Relax your neck muscles completely before you start the next repetition. Repeat 2-3 times. Complete this exercise 1 time per day.  POSTURE AND BODY MECHANICS CONSIDERATIONS Keeping correct posture when sitting, standing or completing your activities will reduce the stress put on different body tissues, allowing injured tissues a chance to heal and limiting painful experiences. The following are general guidelines for improved posture. Your physician or physical therapist will provide you with any instructions specific to your needs. While reading these guidelines, remember: The exercises prescribed by your provider will help you have the flexibility and strength to maintain correct postures. The correct posture provides the optimal environment for your joints to work. All of your joints have less  wear and tear when properly supported by a spine with good posture. This means you will experience a healthier, less painful body. Correct posture must be practiced with all of your activities, especially prolonged sitting and standing. Correct posture is as important when doing repetitive low-stress activities (typing) as it is when doing a single heavy-load activity (lifting).  PROLONGED STANDING WHILE SLIGHTLY LEANING FORWARD When completing a task that requires you to lean forward while standing in one place for a long time, place either foot up on  a stationary 2- to 4-inch high object to help maintain the best posture. When both feet are on the ground, the low back tends to lose its slight inward curve. If this curve flattens (or becomes too large), then the back and your other joints will experience too much stress, fatigue more quickly, and can cause pain.   RESTING POSITIONS Consider which positions are most painful for you when choosing a resting position. If you have pain with flexion-based activities (sitting, bending, stooping, squatting), choose a position that allows you to rest in a less flexed posture. You would want to avoid curling into a fetal position on your side. If your pain worsens with extension-based activities (prolonged standing, working overhead), avoid resting in an extended position such as sleeping on your stomach. Most people will find more comfort when they rest with their spine in a more neutral position, neither too rounded nor too arched. Lying on a non-sagging bed on your side with a pillow between your knees, or on your back with a pillow under your knees will often provide some relief. Keep in mind, being in any one position for a prolonged period of time, no matter how correct your posture, can still lead to stiffness.  WALKING Walk with an upright posture. Your ears, shoulders, and hips should all line up. OFFICE WORK When working at a desk, create an environment that supports good, upright posture. Without extra support, muscles fatigue and lead to excessive strain on joints and other tissues.  CHAIR: A chair should be able to slide under your desk when your back makes contact with the back of the chair. This allows you to work closely. The chair's height should allow your eyes to be level with the upper part of your monitor and your hands to be slightly lower than your elbows. Body position: Your feet should make contact with the floor. If this is not possible, use a foot rest. Keep your ears over your  shoulders. This will reduce stress on your neck and low back.

## 2022-01-14 NOTE — Progress Notes (Signed)
Chief Complaint  Patient presents with   Follow-up    Headache     Subjective: Patient is a 31 y.o. male here for vision changes.  Pt coughed around 1 mo ago and had a B/l headache and loss of visual acuity in the L eye. He went to his eye doctor several times and was told his eye is normal. He even saw a retinal specialist who told him the same things. Having associated b/l headache and a spot on the top of his head toward the L and posterior portion. No inj or change in activity otherwise. He was told he needs a MRI. Stress/anxiety makes it worse.   Past Medical History:  Diagnosis Date   Closed fracture of lateral portion of left tibial plateau 11/25/2019   GERD (gastroesophageal reflux disease)    IBS (irritable bowel syndrome)    Lactose intolerance     Objective: BP 120/80   Pulse 65   Temp 98.4 F (36.9 C) (Oral)   Ht '5\' 5"'$  (1.651 m)   Wt 265 lb 2 oz (120.3 kg)   SpO2 98%   BMI 44.12 kg/m  General: Awake, appears stated age Eyes: PERRLA, EOMi, sclera white Neuro: DTR's equal and symmetric, fluent speech, no clonus, no cerebellar signs Lungs: No accessory muscle use MSK: mild ttp over the TMJ's b/l, posterior L parietal region. No ttp over subocc triangle, parasp cerv msc, temporalis Psych: Age appropriate judgment and insight, normal affect and mood  Assessment and Plan: New daily persistent headache - Plan: MR Brain W Wo Contrast  Vision changes - Plan: MR Brain W Wo Contrast  Family history of thyroid disease - Plan: CANCELED: TSH, CANCELED: T4, free  New problem, uncertain prog. Ck MRI brain, hopefully can be done sooner than 2 mo without him having to go thru ED. For the headache, he will consider Mg daily and neck stretches/exercises that were provided today.  Has seen several eye specialists ruling out eye pathosis.  F/u as originally scheduled or prn.  The patient voiced understanding and agreement to the plan.  [place], DO 01/14/22   7:53 AM

## 2022-01-15 ENCOUNTER — Ambulatory Visit: Payer: 59

## 2022-01-15 DIAGNOSIS — R29898 Other symptoms and signs involving the musculoskeletal system: Secondary | ICD-10-CM

## 2022-01-15 DIAGNOSIS — R252 Cramp and spasm: Secondary | ICD-10-CM

## 2022-01-15 NOTE — Therapy (Signed)
OUTPATIENT PHYSICAL THERAPY TREATMENT   Patient Name: Euel Castile MRN: [id number] DOB:1991/06/23, 31 y.o., male Today's Date: 01/15/2022   PT End of Session - 01/15/22 1018     Visit Number 4    Date for PT Re-Evaluation 02/16/22    Authorization Type [employer]    PT Start Time [phone]    PT Stop Time 1014    PT Time Calculation (min) 40 min    Activity Tolerance Patient tolerated treatment well    Behavior During Therapy [facility] for tasks assessed/performed                Past Medical History:  Diagnosis Date   Closed fracture of lateral portion of left tibial plateau 11/25/2019   GERD (gastroesophageal reflux disease)    IBS (irritable bowel syndrome)    Lactose intolerance    Past Surgical History:  Procedure Laterality Date   ORIF TIBIA PLATEAU Left 11/26/2019   Procedure: OPEN REDUCTION INTERNAL FIXATION (ORIF) TIBIAL PLATEAU;  Surgeon: Marchia Bond, MD;  Location: [place];  Service: Orthopedics;  Laterality: Left;   WISDOM TOOTH EXTRACTION     Patient Active Problem List   Diagnosis Date Noted   Lower abdominal pain 07/01/2021   Hyperlipidemia 03/06/2021   Scalp pain 03/06/2021   Well adult exam 12/04/2020   Cutaneous abscess of abdominal wall 11/13/2020   Testicular pain 11/13/2020   Closed fracture of lateral portion of left tibial plateau 11/25/2019   NAFLD (nonalcoholic fatty liver disease) 10/25/2019   Class 3 severe obesity due to excess calories with serious comorbidity and body mass index (BMI) of 40.0 to 44.9 in adult ([place]) 10/25/2019   Elevated LFTs 07/03/2019   Ankle sprain 07/03/2019    PCP: Shelda Pal, DO  REFERRING PROVIDER: Shelda Pal, DO  REFERRING DIAG: R10.9 (ICD-10-CM) - Abdominal wall pain  RATIONALE FOR EVALUATION AND TREATMENT: Rehabilitation  THERAPY DIAG:  Other symptoms and signs involving the musculoskeletal system  Cramp and spasm  ONSET DATE: ~2 months  SUBJECTIVE:                                                                                                                                                                                            SUBJECTIVE STATEMENT: Pt reports keeping up with HEP pretty regularly, he now notices pain when bending down and leaning to R side.  PAIN:  Are you having pain?  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 Otherwise, unremarkable chest and abdominal radiographs.  SCREENING FOR RED FLAGS: Bowel or bladder incontinence: No Spinal tumors: No Cauda equina syndrome: No Compression fracture: No Abdominal aneurysm: No  COGNITION:  Overall cognitive status: Within functional limits for tasks assessed     SENSATION: WFL  MUSCLE LENGTH: Hamstrings: mild tight B ITB: mild tight B Piriformis: mild/mod tight B Hip flexors: mild tight L > R Quads: mild tight B  POSTURE:  increased lumbar lordosis and anterior pelvic tilt  PALPATION: TTP over R lower obliques  LUMBAR ROM:   Active  A/PROM  eval  Flexion WFL  Extension WFL *  Right lateral flexion WFL  Left lateral flexion WFL  Right rotation WFL  Left rotation WFL *   * - "pulling" noted  LOWER EXTREMITY MMT:    MMT Right eval Left eval  Hip flexion 4  4  Hip extension 4 4+  Hip abduction 4- 4  Hip adduction 4 4+  Hip internal rotation 4+ 4  Hip external rotation 4 4  Knee flexion 5 4  Knee extension 5 4+5  Ankle dorsiflexion 5 5  Ankle plantarflexion 5 5  Ankle inversion    Ankle eversion     (Blank rows = not tested)    TODAY'S TREATMENT  01/15/22 Therapeutic Exercise: Nustep L6x18mn (LE only) Hip hinges with cane 2x10 Deadlifts with 5lb weights to mat table 10 reps FMare Ferraricarries with R UE 5lb 940f 8lb 90 ft Pallof press R/L 2x10 Oblique crunch with opp leg reach in supine 10 reps LTR with green pball 10 reps bil Core bicycles 2x5 reps Quadruped kicks 8 reps  01/12/22 THERAPEUTIC EXERCISE: to improve flexibility, strength and mobility.  Verbal and tactile cues throughout for technique. NuStep L5 x 6 min Hooklying 90/90 sequential march and lower x 10 Hooklying 90/90 LTR x 5 Hooklying TrA + alt knee extension with eccentric SLR x 10 Quadruped bird dog x 5 (discontinued d/t pt uncomfortable with quadruped WBing on L knee) Plank elbows to toes + alt hip extension x 5 Standing TrA + GTB row 2 x 10 Standing TrA + GTB scap retraction/shoulder extension 2 x 10 Standing hip hinge with dowel along spine x 10 Standing GTB R/L pallof press x 20 Standing GTB R/L pallof press + trunk rotation x 10   01/07/22 Therapeutic Exercise: Nustep L4x6m30mLTR 3x10" each side Supine TrA brace 10x5" Supine march with TrA brace GTB 10x3" Supine alt knee taps with GTB 10x3" Supine hip ADD ball squeeze with TrA brace 10x3" Supine 90/90 with BLE, unilateral lowering to mat, then repeat 10x S/L open book 3x10" bil Standing shoulder extension with lat pull bar 10#  20x Cybex row 25# 20 reps Standing deadlift with 6# x 10 - pt noted mild pain radiation towards naval  Standing trunk rotation with GTB 10x bil Standing pallof press with GTB 20x bil  Supine abd bicycle 2x5 reps  01/05/22 THERAPEUTIC EXERCISE: Instruction in initial HEP to  improve flexibility, strength and mobility (see exercises below).  Verbal and tactile cues throughout for technique.   PATIENT EDUCATION:  Education details: HEP progression  Person educated: Patient Education method: Explanation, Demonstration, and [place] code emailed to patient Education comprehension: verbalized understanding, returned demonstration, verbal cues required, and needs further education   HOME EXERCISE PROGRAM: Access Code: [email or web address] URL: [email or web address] Date: 01/15/2022 Prepared by: Clarene Essex  Exercises - Supine Lower Trunk Rotation  - 2 x daily - 7 x weekly - 1-2 sets - 5 reps - 10 sec hold - Sidelying Thoracic Lumbar Rotation  - 2 x daily - 7 x weekly - 1-2 sets - 5 reps - 10 sec hold - Supine March with Resistance Band  - 2 x daily - 7 x weekly - 1-2 sets - 10 reps - 3 sec hold - Supine Single Leg Extensions  - 1 x daily - 7 x weekly - 2 sets - 10 reps - 3 sec hold - Standing Trunk Rotation with Resistance  - 1 x daily - 7 x weekly - 1-2 sets - 10 reps - Standing Bilateral Low Shoulder Row with Anchored Resistance  - 1 x daily - 7 x weekly - 2 sets - 10 reps - 5 sec hold - Scapular Retraction with Resistance Advanced  - 1 x daily - 7 x weekly - 2 sets - 10 reps - 5 sec hold - Supine Bicycles  - 1 x daily - 7 x weekly - 2 sets - 10 reps    ASSESSMENT:  CLINICAL IMPRESSION: Pt showed good response to the progression of exercises today. He was able to do hip extensions in quadruped w/o B UE support. He was able to complete interventions that simulated bending over and picking up groceries and carrying items w/o increased pain.   OBJECTIVE IMPAIRMENTS decreased activity tolerance, decreased knowledge of condition, decreased mobility, decreased strength, increased fascial restrictions, impaired perceived functional ability, increased muscle spasms, impaired flexibility, improper body mechanics, postural dysfunction, and pain.   ACTIVITY  LIMITATIONS carrying, lifting, bending, and reach over head  PARTICIPATION LIMITATIONS: cleaning, shopping, and recreational activities  PERSONAL FACTORS Past/current experiences, Time since onset of injury/illness/exacerbation, and 3+ comorbidities: Ocular migraine with vision loss in L eye; ORIF L tibial plateau fx; HLD; NAFLD; IBS; obesity  are also affecting patient's functional outcome.   REHAB POTENTIAL: Excellent  CLINICAL DECISION MAKING: Stable/uncomplicated  EVALUATION COMPLEXITY: Low   GOALS: Goals reviewed with patient? Yes  SHORT TERM GOALS: Target date: 01/26/2022  Patient will be independent with initial HEP. Baseline: Goal status: IN PROGRESS  LONG TERM GOALS: Target date: 02/16/2022  Patient will be independent with ongoing/advanced HEP +/- gym program for self-management at home. Baseline:  Goal status: IN PROGRESS  2.  Improve understanding of body mechanics and spine care with patient to verbalize and demo correct lifting techniques. Baseline:  Goal status: IN PROGRESS  3.  Patient to report reduction in frequency and intensity of R abdominal wall pain by >/= 50-75% to allow for improved activity tolerance. Baseline:  Goal status: IN PROGRESS  4.  Patient to improve lumbar AROM to WNL without pain provocation. Baseline:  Goal status: IN PROGRESS  5.  Patient will demo strong contraction of TrA to stabilize pelvis during core work. Baseline:  Goal status: IN PROGRESS  6.  Patient will demonstrate improved B proximal LE strength to >/= 4+/5 for improved stability and ease of mobility . Baseline:  Goal status: IN PROGRESS  7.  Patient to report ability to perform ADLs, household, and leisure activities without limitation due to R abdominal wall pain or weakness. Baseline:  Goal status: IN PROGRESS   PLAN: PT FREQUENCY: 1-2x/week  PT DURATION: 6 weeks  PLANNED INTERVENTIONS: Therapeutic exercises, Therapeutic activity, Neuromuscular re-education,  Balance training, Gait training, Patient/Family education, Joint mobilization, Dry Needling, Electrical stimulation, Cryotherapy, Moist heat, Ultrasound, Ionotophoresis '4mg'$ /ml Dexamethasone, Manual therapy, and Re-evaluation.  PLAN FOR NEXT SESSION: Review HEP if needed; clarify appropriate gym-based machines/exercises; progress core/proximal LE strengthening; MT +/- DN to address abnormal muscle tension as indicated    Tion Tse L Raegan Sipp, PTA 01/15/2022, 10:18 AM

## 2022-01-16 ENCOUNTER — Other Ambulatory Visit: Payer: Self-pay | Admitting: Ophthalmology

## 2022-01-16 DIAGNOSIS — H469 Unspecified optic neuritis: Secondary | ICD-10-CM

## 2022-01-19 ENCOUNTER — Encounter: Payer: Self-pay | Admitting: Physical Therapy

## 2022-01-19 ENCOUNTER — Ambulatory Visit: Payer: 59 | Admitting: Physical Therapy

## 2022-01-19 DIAGNOSIS — R29898 Other symptoms and signs involving the musculoskeletal system: Secondary | ICD-10-CM

## 2022-01-19 DIAGNOSIS — R252 Cramp and spasm: Secondary | ICD-10-CM

## 2022-01-20 ENCOUNTER — Telehealth (HOSPITAL_BASED_OUTPATIENT_CLINIC_OR_DEPARTMENT_OTHER): Payer: Self-pay

## 2022-01-20 NOTE — Therapy (Signed)
OUTPATIENT PHYSICAL THERAPY TREATMENT   Patient Name: Shawn Mcknight MRN: [id number] DOB:July 23, 1991, 31 y.o., male Today's Date: 01/21/2022   PT End of Session - 01/21/22 0934     Visit Number 6    Date for PT Re-Evaluation 02/16/22    Authorization Type [employer]    PT Start Time 0930    PT Stop Time 4481    PT Time Calculation (min) 45 min    Activity Tolerance Patient tolerated treatment well    Behavior During Therapy [facility] for tasks assessed/performed                  Past Medical History:  Diagnosis Date   Closed fracture of lateral portion of left tibial plateau 11/25/2019   GERD (gastroesophageal reflux disease)    IBS (irritable bowel syndrome)    Lactose intolerance    Past Surgical History:  Procedure Laterality Date   ORIF TIBIA PLATEAU Left 11/26/2019   Procedure: OPEN REDUCTION INTERNAL FIXATION (ORIF) TIBIAL PLATEAU;  Surgeon: Marchia Bond, MD;  Location: [place];  Service: Orthopedics;  Laterality: Left;   WISDOM TOOTH EXTRACTION     Patient Active Problem List   Diagnosis Date Noted   Lower abdominal pain 07/01/2021   Hyperlipidemia 03/06/2021   Scalp pain 03/06/2021   Well adult exam 12/04/2020   Cutaneous abscess of abdominal wall 11/13/2020   Testicular pain 11/13/2020   Closed fracture of lateral portion of left tibial plateau 11/25/2019   NAFLD (nonalcoholic fatty liver disease) 10/25/2019   Class 3 severe obesity due to excess calories with serious comorbidity and body mass index (BMI) of 40.0 to 44.9 in adult ([place]) 10/25/2019   Elevated LFTs 07/03/2019   Ankle sprain 07/03/2019    PCP: Shelda Pal, DO  REFERRING PROVIDER: Shelda Pal, DO  REFERRING DIAG: R10.9 (ICD-10-CM) - Abdominal wall pain  RATIONALE FOR EVALUATION AND TREATMENT: Rehabilitation  THERAPY DIAG:  Other symptoms and signs involving the musculoskeletal system  Cramp and spasm  ONSET DATE: ~2 months  SUBJECTIVE:                                                                                                                                                                                            SUBJECTIVE STATEMENT: I get the pain with certain movements and usually weird movements. It's improving some. Still with lateral left lower leg pain intermittently. Didn't do HEP yesterday due to working a long day.  PAIN:  Are you having pain?  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 Otherwise, unremarkable chest and abdominal radiographs.  SCREENING FOR RED FLAGS: Bowel or bladder incontinence: No Spinal tumors: No Cauda equina syndrome: No Compression fracture: No Abdominal aneurysm: No  COGNITION:  Overall cognitive status: Within functional limits for tasks assessed     SENSATION: WFL  MUSCLE LENGTH: Hamstrings: mild tight B ITB: mild tight B Piriformis: mild/mod tight B Hip flexors: mild tight L > R Quads: mild tight B  POSTURE:  increased lumbar lordosis and anterior pelvic tilt  PALPATION: TTP over R lower obliques  LUMBAR ROM:   Active  A/PROM  eval  Flexion WFL  Extension WFL *  Right lateral flexion WFL  Left lateral flexion WFL  Right rotation WFL  Left rotation WFL *   * -  "pulling" noted  LOWER EXTREMITY MMT:    MMT Right eval Left eval  Hip flexion 4 4  Hip extension 4 4+  Hip abduction 4- 4  Hip adduction 4 4+  Hip internal rotation 4+ 4  Hip external rotation 4 4  Knee flexion 5 4  Knee extension 5 4+5  Ankle dorsiflexion 5 5  Ankle plantarflexion 5 5  Ankle inversion    Ankle eversion     (Blank rows = not tested)    TODAY'S TREATMENT   01/21/22 THERAPEUTIC EXERCISE: to improve flexibility, strength and mobility.  Verbal and tactile cues throughout for technique. Nustep L6 x 6 min (LE only) THERAPEUTIC EXERCISE: to improve flexibility, strength and mobility.  Verbal and tactile cues throughout for technique. Nustep L6 x 6 min (LE only) Standing R/L BTB pallof press 2 x 10 Standing R/L GTB pallof press + trunk rotation 2 x 10 Standing BTB 2 hand lift & chop diagonal trunk rotations 2 x 10 Seated on green PBall TrA+ GTB rows 2 x 10 Seated on green PBall with knee ext and TrA GTB row x 5 ea Seated on green PBall TrA + GTB scap retraction/shoulder extension 2 x 10 BATCA 25# alt single arm row x 20 Free squat to 90 holding 10# db 2 x 10 10# dead lift x 10  Beast 3 x 10 sec    01/19/22 THERAPEUTIC EXERCISE: to improve flexibility, strength and mobility.  Verbal and tactile cues throughout for technique. Nustep L6 x 6 min (LE only) Standing R/L GTB pallof press 2 x 10 Standing R/L GTB pallof press + trunk rotation 2 x 10 Standing GTB 2 hand lift & chop diagonal trunk rotations 2 x 10 Modified quadruped (feet on floor with trunk flexed and hands on mat table) thread & reach 2 x 10 Seated on green PBall TrA+ GTB rows 2 x 10 Seated on green PBall TrA + GTB scap retraction/shoulder extension 2 x 10 BATCA 20# alt single arm row x 20 Free squat to 90 holding 10# db 2 x 10 10# dead lift  x 10   01/15/22 Therapeutic Exercise: Nustep L6x25mn (LE only) Hip hinges with cane 2x10 Deadlifts with 5lb weights to mat table 10 reps FMare Ferrari carries with R UE 5lb 936f 8lb 90 ft Pallof press R/L 2x10 Oblique crunch with opp leg reach in supine 10 reps LTR with green pball 10 reps bil Core bicycles 2x5 reps Quadruped kicks 8 reps     PATIENT EDUCATION:  Education details: HEP progression  Person educated: Patient Education method: Explanation, Demonstration, and [place] emailed to patient Education comprehension: verbalized understanding, returned demonstration, verbal cues required, and needs further education   HOME EXERCISE PROGRAM: Access Code: [email or web address]: [email or web address] Date: 01/15/2022 Prepared by: BrClarene EssexExercises - Supine Lower Trunk Rotation  - 2 x daily - 7 x weekly - 1-2 sets - 5 reps - 10 sec hold - Sidelying Thoracic Lumbar Rotation  - 2 x daily - 7 x weekly - 1-2 sets - 5 reps - 10 sec hold - Supine March with Resistance Band  - 2 x daily - 7 x weekly - 1-2 sets - 10 reps - 3 sec hold - Supine Single Leg Extensions  - 1 x daily - 7 x weekly - 2 sets - 10 reps - 3 sec hold - Standing Trunk Rotation with Resistance  - 1 x daily - 7 x weekly - 1-2 sets - 10 reps - Standing Bilateral Low Shoulder Row with Anchored Resistance  - 1 x daily - 7 x weekly - 2 sets - 10 reps - 5 sec hold - Scapular Retraction with Resistance Advanced  - 1 x daily - 7 x weekly - 2 sets - 10 reps - 5 sec hold - Supine Bicycles  - 1 x daily - 7 x weekly - 2 sets - 10 reps    ASSESSMENT:  CLINICAL IMPRESSION: CaEmmits doing well with core strengthening and was able to progress resistance without increased pain today. He did report some pain with deadlifts in the low back but form was corrected and pain did not remain after exercise completed.   OBJECTIVE IMPAIRMENTS decreased activity tolerance, decreased knowledge of condition, decreased mobility, decreased strength, increased fascial restrictions, impaired perceived functional ability, increased muscle spasms, impaired flexibility, improper  body mechanics, postural dysfunction, and pain.   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   REHAB POTENTIAL: Excellent  CLINICAL DECISION MAKING: Stable/uncomplicated  EVALUATION COMPLEXITY: Low   GOALS: Goals reviewed with patient? Yes  SHORT TERM GOALS: Target date: 01/26/2022  Patient will be independent with initial HEP. Baseline: Goal status: MET  01/19/22  LONG TERM GOALS: Target date: 02/16/2022  Patient will be independent with ongoing/advanced HEP +/- gym program for self-management at home. Baseline:  Goal status: IN PROGRESS  2.  Improve understanding of body mechanics and spine care with patient to verbalize and demo correct lifting techniques. Baseline:  Goal status: IN PROGRESS  3.  Patient to report reduction in frequency and intensity of R abdominal wall pain by >/= 50-75% to allow for improved activity tolerance. Baseline:  Goal status: IN PROGRESS  4.  Patient to improve lumbar AROM to WNL without pain provocation. Baseline:  Goal status: IN PROGRESS  5.  Patient will  demo strong contraction of TrA to stabilize pelvis during core work. Baseline:  Goal status: IN PROGRESS  6.  Patient will demonstrate improved B proximal LE strength to >/= 4+/5 for improved stability and ease of mobility . Baseline:  Goal status: IN PROGRESS  7.  Patient to report ability to perform ADLs, household, and leisure activities without limitation due to R abdominal wall pain or weakness. Baseline:  Goal status: IN PROGRESS   PLAN: PT FREQUENCY: 1-2x/week  PT DURATION: 6 weeks  PLANNED INTERVENTIONS:  Therapeutic exercises, Therapeutic activity, Neuromuscular re-education, Balance training, Gait training, Patient/Family education, Joint mobilization, Dry Needling, Electrical stimulation, Cryotherapy, Moist heat, Ultrasound, Ionotophoresis 23m/ml Dexamethasone, Manual therapy, and Re-evaluation.  PLAN FOR NEXT SESSION:  Review HEP if needed; clarify appropriate gym-based machines/exercises; progress core/proximal LE strengthening; MT +/- DN to address abnormal muscle tension as indicated    Brighton Pilley, PT 01/21/2022, 3:12 PM

## 2022-01-21 ENCOUNTER — Ambulatory Visit: Payer: 59 | Admitting: Physical Therapy

## 2022-01-21 ENCOUNTER — Encounter: Payer: Self-pay | Admitting: Physical Therapy

## 2022-01-21 DIAGNOSIS — R252 Cramp and spasm: Secondary | ICD-10-CM

## 2022-01-21 DIAGNOSIS — R29898 Other symptoms and signs involving the musculoskeletal system: Secondary | ICD-10-CM

## 2022-02-02 ENCOUNTER — Encounter: Payer: Self-pay | Admitting: Physical Therapy

## 2022-02-02 ENCOUNTER — Ambulatory Visit: Payer: 59 | Attending: Family Medicine | Admitting: Physical Therapy

## 2022-02-02 DIAGNOSIS — R252 Cramp and spasm: Secondary | ICD-10-CM | POA: Insufficient documentation

## 2022-02-02 DIAGNOSIS — R29898 Other symptoms and signs involving the musculoskeletal system: Secondary | ICD-10-CM | POA: Diagnosis not present

## 2022-02-02 NOTE — Therapy (Signed)
OUTPATIENT PHYSICAL THERAPY TREATMENT   Patient Name: Shawn Mcknight MRN: [id number] DOB:May 21, 1991, 31 y.o., male Today's Date: 02/02/2022   PT End of Session - 02/02/22 0846     Visit Number 7    Date for PT Re-Evaluation 02/16/22    Authorization Type [employer]    PT Start Time [phone]    PT Stop Time 0930    PT Time Calculation (min) 44 min    Activity Tolerance Patient tolerated treatment well    Behavior During Therapy [facility] for tasks assessed/performed                   Past Medical History:  Diagnosis Date   Closed fracture of lateral portion of left tibial plateau 11/25/2019   GERD (gastroesophageal reflux disease)    IBS (irritable bowel syndrome)    Lactose intolerance    Past Surgical History:  Procedure Laterality Date   ORIF TIBIA PLATEAU Left 11/26/2019   Procedure: OPEN REDUCTION INTERNAL FIXATION (ORIF) TIBIAL PLATEAU;  Surgeon: Marchia Bond, MD;  Location: [place];  Service: Orthopedics;  Laterality: Left;   WISDOM TOOTH EXTRACTION     Patient Active Problem List   Diagnosis Date Noted   Lower abdominal pain 07/01/2021   Hyperlipidemia 03/06/2021   Scalp pain 03/06/2021   Well adult exam 12/04/2020   Cutaneous abscess of abdominal wall 11/13/2020   Testicular pain 11/13/2020   Closed fracture of lateral portion of left tibial plateau 11/25/2019   NAFLD (nonalcoholic fatty liver disease) 10/25/2019   Class 3 severe obesity due to excess calories with serious comorbidity and body mass index (BMI) of 40.0 to 44.9 in adult ([place]) 10/25/2019   Elevated LFTs 07/03/2019   Ankle sprain 07/03/2019    PCP: Shelda Pal, DO  REFERRING PROVIDER: Shelda Pal, DO  REFERRING DIAG: R10.9 (ICD-10-CM) - Abdominal wall pain  RATIONALE FOR EVALUATION AND TREATMENT: Rehabilitation  THERAPY DIAG:  Other symptoms and signs involving the musculoskeletal system  Cramp and spasm  ONSET DATE: ~2 months  SUBJECTIVE:                                                                                                                                                                                            SUBJECTIVE STATEMENT: Pt reports he hasn't noticed any pain in the last week except for yesterday when he was in an awkward position.  PAIN:  Are you having pain?  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 Otherwise, unremarkable chest and abdominal radiographs.  SCREENING FOR RED FLAGS: Bowel or bladder incontinence: No Spinal tumors: No Cauda equina syndrome: No Compression fracture: No Abdominal aneurysm: No  COGNITION:  Overall cognitive status: Within functional limits for tasks assessed     SENSATION: WFL  MUSCLE LENGTH: Hamstrings: mild tight B ITB: mild tight B Piriformis: mild/mod tight B Hip flexors: mild tight L > R Quads: mild tight B  POSTURE:  increased lumbar lordosis and anterior pelvic tilt  PALPATION: TTP over R lower obliques  LUMBAR ROM:   Active  AROM  Eval 01/05/22  AROM 02/02/22  Flexion WFL WNL  Extension WFL * WNL *  Right lateral flexion WFL WNL  Left lateral flexion WFL WNL  Right rotation WFL WNL  Left rotation WFL * WNL   * - "pulling" noted  LOWER  EXTREMITY MMT:    MMT Right Eval 01/05/22 Left Eval 01/05/22  Right 02/02/22  Left 02/02/22  Hip flexion _0 5-  Hip extension 4 4+ 4+ 4+  Hip abduction 4- 4 5- 5  Hip adduction 4 4+ 5 5  Hip internal rotation 4+ 4 5 4+  Hip external rotation 4 4 5- 4+  Knee flexion _1 Knee extension 5 4+_2 Ankle dorsiflexion 5 5    Ankle plantarflexion 5 5    Ankle inversion      Ankle eversion       (Blank rows = not tested)    TODAY'S TREATMENT   02/02/22 THERAPEUTIC EXERCISE: to improve flexibility, strength and mobility.  Verbal and tactile cues throughout for technique. Rec Bike - L5 x 6 min Sidelying R/L GTB clam 10 x 3" Side plank R/L GTB clam 10 x 3" Quadruped GTB fire hydrants 10 x 3" Bridge + GTB clam 10 x 3" Bridge + GTB alt single LE hip ABD/ER 10 x 3"  L/R SLS RDL reach to mat table 10 x 3" each LE L/R lunges with single UE support on back of chair 10 x 3" - pt noting increased pulling in R ankle related to recent ankle sprain R gastroc/soleus stretches 2 x 30"   01/21/22 THERAPEUTIC EXERCISE: to improve flexibility, strength and mobility.  Verbal and tactile cues throughout for technique. Nustep L6 x 6 min (LE only) Standing R/L BTB pallof press 2 x 10 Standing R/L GTB pallof press + trunk rotation 2 x 10 Standing BTB 2 hand lift & chop diagonal trunk rotations 2 x 10 Seated on green PBall TrA+ GTB rows 2 x 10 Seated on green PBall with knee ext and TrA GTB row x 5 ea Seated on green PBall TrA + GTB scap retraction/shoulder extension 2 x 10 BATCA 25# alt single arm row x 20 Free squat to 90 holding 10# db 2 x 10 10# dead lift x 10  Beast (Primal pushup) 3 x 10  sec   01/19/22 THERAPEUTIC EXERCISE: to improve flexibility, strength and mobility.  Verbal and tactile cues throughout for technique. Nustep L6 x 6 min (LE only) Standing R/L GTB pallof press 2 x 10 Standing R/L GTB pallof press + trunk rotation 2 x 10 Standing GTB 2 hand lift & chop diagonal  trunk rotations 2 x 10 Modified quadruped (feet on floor with trunk flexed and hands on mat table) thread & reach 2 x 10 Seated on green PBall TrA+ GTB rows 2 x 10 Seated on green PBall TrA + GTB scap retraction/shoulder extension 2 x 10 BATCA 20# alt single arm row x 20 Free squat to 90 holding 10# db 2 x 10 10# dead lift x 10   PATIENT EDUCATION:  Education details: HEP progression  Person educated: Patient Education method: Explanation, Demonstration, and [place] code emailed to patient Education comprehension: verbalized understanding, returned demonstration, verbal cues required, and needs further education   HOME EXERCISE PROGRAM: Access Code: [email or web address] URL: [email or web address] Date: 02/02/2022 Prepared by: Annie Paras  Exercises - Supine Lower Trunk Rotation  - 2 x daily - 7 x weekly - 1-2 sets - 5 reps - 10 sec hold - Sidelying Thoracic Lumbar Rotation  - 2 x daily - 7 x weekly - 1-2 sets - 5 reps - 10 sec hold - Supine March with Resistance Band  - 2 x daily - 7 x weekly - 1-2 sets - 10 reps - 3 sec hold - Supine Single Leg Extensions  - 1 x daily - 7 x weekly - 2 sets - 10 reps - 3 sec hold - Standing Trunk Rotation with Resistance  - 1 x daily - 7 x weekly - 1-2 sets - 10 reps - Standing Bilateral Low Shoulder Row with Anchored Resistance  - 1 x daily - 7 x weekly - 2 sets - 10 reps - 5 sec hold - Scapular Retraction with Resistance Advanced  - 1 x daily - 7 x weekly - 2 sets - 10 reps - 5 sec hold - Supine Bicycles  - 1 x daily - 7 x weekly - 2 sets - 10 reps - Clam with Resistance  - 1 x daily - 3-4 x weekly - 2 sets - 10 reps - 3-5 sec hold - Side Plank with Clam and Resistance  - 1 x daily - 3 x weekly - 2 sets - 10 reps - 3 sec hold - Bridge with Hip Abduction and Resistance  - 1 x daily - 3-4 x weekly - 2 sets - 10 reps - 5 sec hold - The Diver  - 1 x daily - 3-4 x weekly - 2 sets - 10 reps - 3 sec hold - Standard Lunge  - 1 x daily - 3-4 x  weekly - 2 sets - 10 reps - 3 sec hold    ASSESSMENT:  CLINICAL IMPRESSION: Chaos reports he has been able to resume most normal activities including riding his bike and lifting the bike on and off the bike rack on the car w/o pain. He is able to lift and carry the laundry basket w/o pain but did note some pain when he leaned over to pick something up while carrying the laundry basket. He will benefit from additional training in good posture and body mechanics. MMT revealing good strength gains but still with some proximal LE weakness, primarily in hip extension and rotation. Therapeutic exercises progressed to address ongoing weakness with HEP updated accordingly. Vernice is progressing well  toward his goals and should be ready to transition to his HEP by the end of the current POC.  OBJECTIVE IMPAIRMENTS decreased activity tolerance, decreased knowledge of condition, decreased mobility, decreased strength, increased fascial restrictions, impaired perceived functional ability, increased muscle spasms, impaired flexibility, improper body mechanics, postural dysfunction, and pain.   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   REHAB POTENTIAL: Excellent  CLINICAL DECISION MAKING: Stable/uncomplicated  EVALUATION COMPLEXITY: Low   GOALS: Goals reviewed with patient? Yes  SHORT TERM GOALS: Target date: 01/26/2022  Patient will be independent with initial HEP. Baseline: Goal status: MET  01/19/22  LONG TERM GOALS: Target date: 02/16/2022  Patient will be independent with ongoing/advanced HEP +/- gym program for self-management at  home. Baseline:  Goal status: IN PROGRESS  02/02/22 - Met for current HEP but updated today  2.  Improve understanding of body mechanics and spine care with patient to verbalize and demo correct lifting techniques. Baseline:  Goal status: IN PROGRESS  3.  Patient to report reduction in frequency and intensity of R abdominal wall pain by >/= 50-75% to allow for improved activity tolerance. Baseline:  Goal status: MET  02/02/22 - 80% improvement in pain reported  4.  Patient to improve lumbar AROM to WNL without pain provocation. Baseline:  Goal status: IN PROGRESS  02/02/22 - Met except very mild pull noted in R LQ with lumbar extension  5.  Patient will demo strong contraction of TrA to stabilize pelvis during core work. Baseline:  Goal status: MET  02/02/22  6.  Patient will demonstrate improved B proximal LE strength to >/= 4+/5 for improved stability and ease of mobility . Baseline:  Goal status: MET  02/02/22  7.  Patient to report ability to perform ADLs, household, and leisure activities without limitation due to R abdominal wall pain or weakness. Baseline:  Goal status: IN PROGRESS  02/02/22 - Mostly met - only noting slight discomfort noted when in odd positions such as leaning over while carrying something like a laundry basket   PLAN: PT FREQUENCY: 1-2x/week  PT DURATION: 6 weeks  PLANNED INTERVENTIONS: Therapeutic exercises, Therapeutic activity, Neuromuscular re-education, Balance training, Gait training, Patient/Family education, Joint mobilization, Dry Needling, Electrical stimulation, Cryotherapy, Moist heat, Ultrasound, Ionotophoresis 71m/ml Dexamethasone, Manual therapy, and Re-evaluation.  PLAN FOR NEXT SESSION:  review proper posture and body mechanics; clarify appropriate gym-based machines/exercises; progress core/proximal LE strengthening; MT +/- DN to address abnormal muscle tension as indicated    JPercival Spanish PT 02/02/2022, 9:48 AM

## 2022-02-03 ENCOUNTER — Ambulatory Visit
Admission: RE | Admit: 2022-02-03 | Discharge: 2022-02-03 | Disposition: A | Discharge status: Home / Self Care | Payer: 59 | Source: Ambulatory Visit | Attending: Ophthalmology | Admitting: Ophthalmology

## 2022-02-03 DIAGNOSIS — H469 Unspecified optic neuritis: Secondary | ICD-10-CM

## 2022-02-03 DIAGNOSIS — H539 Unspecified visual disturbance: Secondary | ICD-10-CM

## 2022-02-03 DIAGNOSIS — G4452 New daily persistent headache (NDPH): Secondary | ICD-10-CM

## 2022-02-03 MED ORDER — GADOBENATE DIMEGLUMINE 529 MG/ML IV SOLN
20.0000 mL | Freq: Once | INTRAVENOUS | Status: AC | PRN
  Administered 2022-02-03: 20 mL via INTRAVENOUS

## 2022-02-04 ENCOUNTER — Ambulatory Visit: Payer: 59

## 2022-02-04 DIAGNOSIS — R29898 Other symptoms and signs involving the musculoskeletal system: Secondary | ICD-10-CM

## 2022-02-04 DIAGNOSIS — R252 Cramp and spasm: Secondary | ICD-10-CM

## 2022-02-04 NOTE — Therapy (Signed)
OUTPATIENT PHYSICAL THERAPY TREATMENT   Patient Name: Shawn Mcknight MRN: [id number] DOB:25-Mar-1991, 31 y.o., male Today's Date: 02/04/2022   PT End of Session - 02/04/22 1022     Visit Number 8    Date for PT Re-Evaluation 02/16/22    Authorization Type [employer]    PT Start Time 0933    PT Stop Time 1014    PT Time Calculation (min) 41 min    Activity Tolerance Patient tolerated treatment well    Behavior During Therapy [facility] for tasks assessed/performed                    Past Medical History:  Diagnosis Date   Closed fracture of lateral portion of left tibial plateau 11/25/2019   GERD (gastroesophageal reflux disease)    IBS (irritable bowel syndrome)    Lactose intolerance    Past Surgical History:  Procedure Laterality Date   ORIF TIBIA PLATEAU Left 11/26/2019   Procedure: OPEN REDUCTION INTERNAL FIXATION (ORIF) TIBIAL PLATEAU;  Surgeon: Marchia Bond, MD;  Location: [place];  Service: Orthopedics;  Laterality: Left;   WISDOM TOOTH EXTRACTION     Patient Active Problem List   Diagnosis Date Noted   Lower abdominal pain 07/01/2021   Hyperlipidemia 03/06/2021   Scalp pain 03/06/2021   Well adult exam 12/04/2020   Cutaneous abscess of abdominal wall 11/13/2020   Testicular pain 11/13/2020   Closed fracture of lateral portion of left tibial plateau 11/25/2019   NAFLD (nonalcoholic fatty liver disease) 10/25/2019   Class 3 severe obesity due to excess calories with serious comorbidity and body mass index (BMI) of 40.0 to 44.9 in adult [facility]) 10/25/2019   Elevated LFTs 07/03/2019   Ankle sprain 07/03/2019    PCP: Shelda Pal, DO  REFERRING PROVIDER: Shelda Pal, DO  REFERRING DIAG: R10.9 (ICD-10-CM) - Abdominal wall pain  RATIONALE FOR EVALUATION AND TREATMENT: Rehabilitation  THERAPY DIAG:  Other symptoms and signs involving the musculoskeletal system  Cramp and spasm  ONSET DATE: ~2 months  SUBJECTIVE:                                                                                                                                                                                            SUBJECTIVE STATEMENT: Pt notes he still pain when twisting to place items in drawer at home.  PAIN:  Are you having pain?  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 Otherwise, unremarkable chest and abdominal radiographs.  SCREENING FOR RED FLAGS: Bowel or bladder incontinence: No Spinal tumors: No Cauda equina syndrome: No Compression fracture: No Abdominal aneurysm: No  COGNITION:  Overall cognitive status: Within functional limits for tasks assessed     SENSATION: WFL  MUSCLE LENGTH: Hamstrings: mild tight B ITB: mild tight B Piriformis: mild/mod tight B Hip flexors: mild tight L > R Quads: mild tight B  POSTURE:  increased lumbar lordosis and anterior pelvic tilt  PALPATION: TTP over R lower obliques  LUMBAR ROM:   Active  AROM  Eval 01/05/22  AROM 02/02/22  Flexion WFL WNL  Extension WFL * WNL *  Right lateral flexion WFL WNL  Left lateral flexion WFL WNL  Right rotation WFL WNL  Left rotation WFL * WNL   * - "pulling" noted  LOWER EXTREMITY MMT:    MMT Right Eval 01/05/22  Left Eval 01/05/22  Right 02/02/22  Left 02/02/22  Hip flexion '4 4 5 ' 5-  Hip extension 4 4+ 4+ 4+  Hip abduction 4- 4 5- 5  Hip adduction 4 4+ 5 5  Hip internal rotation 4+ 4 5 4+  Hip external rotation 4 4 5- 4+  Knee flexion '5 4 5 5  ' Knee extension 5 4+'5 5 5  ' Ankle dorsiflexion 5 5    Ankle plantarflexion 5 5    Ankle inversion      Ankle eversion       (Blank rows = not tested)    TODAY'S TREATMENT  02/04/22 Therapeutic Exercise: Nustep L6x59mn Standing shoulder extension 2x10 20# Standing row 25# 2x10 S/L clam + plank  GTB x 10 bil S/L fire hydrant GTB x 10 bil Quadruped fire hydrant x 10 Qaudruped hip extension with knee bent x 10  Therapeutic Activities: Squatting with 10lb DB starting with on top of counter placing on 9' stool to reciprocate placing clothes in drawer  Carrying 25lb box 2x for 90 ft   02/02/22 THERAPEUTIC EXERCISE: to improve flexibility, strength and mobility.  Verbal and tactile cues throughout for technique. Rec Bike - L5 x 6 min Sidelying R/L GTB clam 10 x 3" Side plank R/L GTB clam 10 x 3" Quadruped GTB fire hydrants 10 x 3" Bridge + GTB clam 10 x 3" Bridge + GTB alt single LE hip ABD/ER 10 x 3"  L/R SLS RDL reach to mat table 10 x 3" each LE L/R lunges with single UE support on back of chair 10 x 3" - pt noting increased pulling in R ankle related to recent ankle sprain R gastroc/soleus stretches 2 x 30"   01/21/22 THERAPEUTIC EXERCISE: to improve flexibility, strength and mobility.  Verbal and tactile cues throughout for technique. Nustep L6 x 6 min (LE only) Standing R/L BTB pallof press 2 x 10 Standing R/L GTB pallof press + trunk rotation 2 x 10 Standing BTB 2 hand lift & chop diagonal trunk rotations  2 x 10 Seated on green PBall TrA+ GTB rows 2 x 10 Seated on green PBall with knee ext and TrA GTB row x 5 ea Seated on green PBall TrA + GTB scap retraction/shoulder extension 2 x 10 BATCA 25# alt single arm row x 20 Free squat to  90 holding 10# db 2 x 10 10# dead lift x 10  Beast (Primal pushup) 3 x 10 sec    PATIENT EDUCATION:  Education details: HEP progression  Person educated: Patient Education method: Explanation, Demonstration, and [place] code emailed to patient Education comprehension: verbalized understanding, returned demonstration, verbal cues required, and needs further education   HOME EXERCISE PROGRAM: Access Code: [email or web address] URL: [email or web address] Date: 02/02/2022 Prepared by: Annie Paras  Exercises - Supine Lower Trunk Rotation  - 2 x daily - 7 x weekly - 1-2 sets - 5 reps - 10 sec hold - Sidelying Thoracic Lumbar Rotation  - 2 x daily - 7 x weekly - 1-2 sets - 5 reps - 10 sec hold - Supine March with Resistance Band  - 2 x daily - 7 x weekly - 1-2 sets - 10 reps - 3 sec hold - Supine Single Leg Extensions  - 1 x daily - 7 x weekly - 2 sets - 10 reps - 3 sec hold - Standing Trunk Rotation with Resistance  - 1 x daily - 7 x weekly - 1-2 sets - 10 reps - Standing Bilateral Low Shoulder Row with Anchored Resistance  - 1 x daily - 7 x weekly - 2 sets - 10 reps - 5 sec hold - Scapular Retraction with Resistance Advanced  - 1 x daily - 7 x weekly - 2 sets - 10 reps - 5 sec hold - Supine Bicycles  - 1 x daily - 7 x weekly - 2 sets - 10 reps - Clam with Resistance  - 1 x daily - 3-4 x weekly - 2 sets - 10 reps - 3-5 sec hold - Side Plank with Clam and Resistance  - 1 x daily - 3 x weekly - 2 sets - 10 reps - 3 sec hold - Bridge with Hip Abduction and Resistance  - 1 x daily - 3-4 x weekly - 2 sets - 10 reps - 5 sec hold - The Diver  - 1 x daily - 3-4 x weekly - 2 sets - 10 reps - 3 sec hold - Standard Lunge  - 1 x daily - 3-4 x weekly - 2 sets - 10 reps - 3 sec hold    ASSESSMENT:  CLINICAL IMPRESSION: Pt responded well to treatment. Cueing provided throughout session for form and technique. Instructed pt on proper lifting techniques to avoid strain on abdominals and  LB.  OBJECTIVE IMPAIRMENTS decreased activity tolerance, decreased knowledge of condition, decreased mobility, decreased strength, increased fascial restrictions, impaired perceived functional ability, increased muscle spasms, impaired flexibility, improper body mechanics, postural dysfunction, and pain.   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   REHAB POTENTIAL: Excellent  CLINICAL DECISION MAKING: Stable/uncomplicated  EVALUATION COMPLEXITY: Low   GOALS: Goals reviewed with patient? Yes  SHORT TERM GOALS: Target date: 01/26/2022  Patient will be independent with initial HEP. Baseline: Goal status: MET  01/19/22  LONG TERM GOALS: Target date: 02/16/2022  Patient will be independent with ongoing/advanced HEP +/- gym program for self-management at home. Baseline:  Goal status: IN PROGRESS  02/02/22 - Met for current HEP but updated today  2.  Improve understanding of body mechanics and spine care with patient to verbalize and demo correct lifting techniques. Baseline:  Goal status: IN PROGRESS  3.  Patient to report reduction in frequency and intensity of R abdominal wall pain by >/= 50-75% to allow for improved activity tolerance. Baseline:  Goal status: MET  02/02/22 - 80% improvement in pain reported  4.  Patient to improve lumbar AROM to WNL without pain provocation. Baseline:  Goal status: IN PROGRESS  02/02/22 - Met except very mild pull noted in R LQ with lumbar extension  5.  Patient will demo strong contraction of TrA to stabilize pelvis during core work. Baseline:  Goal status: MET  02/02/22  6.  Patient will  demonstrate improved B proximal LE strength to >/= 4+/5 for improved stability and ease of mobility . Baseline:  Goal status: MET  02/02/22  7.  Patient to report ability to perform ADLs, household, and leisure activities without limitation due to R abdominal wall pain or weakness. Baseline:  Goal status: IN PROGRESS  02/02/22 - Mostly met - only noting slight discomfort noted when in odd positions such as leaning over while carrying something like a laundry basket   PLAN: PT FREQUENCY: 1-2x/week  PT DURATION: 6 weeks  PLANNED INTERVENTIONS: Therapeutic exercises, Therapeutic activity, Neuromuscular re-education, Balance training, Gait training, Patient/Family education, Joint mobilization, Dry Needling, Electrical stimulation, Cryotherapy, Moist heat, Ultrasound, Ionotophoresis 67m/ml Dexamethasone, Manual therapy, and Re-evaluation.  PLAN FOR NEXT SESSION:  review proper posture and body mechanics; clarify appropriate gym-based machines/exercises; progress core/proximal LE strengthening; MT +/- DN to address abnormal muscle tension as indicated    Miran Kautzman L CCarlis Abbott PTA 02/04/2022, 10:35 AM

## 2022-02-09 ENCOUNTER — Ambulatory Visit: Payer: 59

## 2022-02-09 DIAGNOSIS — R252 Cramp and spasm: Secondary | ICD-10-CM

## 2022-02-09 DIAGNOSIS — R29898 Other symptoms and signs involving the musculoskeletal system: Secondary | ICD-10-CM | POA: Diagnosis not present

## 2022-02-09 NOTE — Therapy (Addendum)
OUTPATIENT PHYSICAL THERAPY TREATMENT   Patient Name: Shawn Mcknight MRN: [id number] DOB:Jan 08, 1991, 31 y.o., male Today's Date: 02/09/2022   PT End of Session - 02/09/22 0932     Visit Number 9    Date for PT Re-Evaluation 02/16/22    Authorization Type [employer]    PT Start Time [phone]    PT Stop Time 0930    PT Time Calculation (min) 41 min    Activity Tolerance Patient tolerated treatment well    Behavior During Therapy [facility] for tasks assessed/performed                     Past Medical History:  Diagnosis Date   Closed fracture of lateral portion of left tibial plateau 11/25/2019   GERD (gastroesophageal reflux disease)    IBS (irritable bowel syndrome)    Lactose intolerance    Past Surgical History:  Procedure Laterality Date   ORIF TIBIA PLATEAU Left 11/26/2019   Procedure: OPEN REDUCTION INTERNAL FIXATION (ORIF) TIBIAL PLATEAU;  Surgeon: Marchia Bond, MD;  Location: [place];  Service: Orthopedics;  Laterality: Left;   WISDOM TOOTH EXTRACTION     Patient Active Problem List   Diagnosis Date Noted   Lower abdominal pain 07/01/2021   Hyperlipidemia 03/06/2021   Scalp pain 03/06/2021   Well adult exam 12/04/2020   Cutaneous abscess of abdominal wall 11/13/2020   Testicular pain 11/13/2020   Closed fracture of lateral portion of left tibial plateau 11/25/2019   NAFLD (nonalcoholic fatty liver disease) 10/25/2019   Class 3 severe obesity due to excess calories with serious comorbidity and body mass index (BMI) of 40.0 to 44.9 in adult ([place]) 10/25/2019   Elevated LFTs 07/03/2019   Ankle sprain 07/03/2019    PCP: Shelda Pal, DO  REFERRING PROVIDER: Shelda Pal, DO  REFERRING DIAG: R10.9 (ICD-10-CM) - Abdominal wall pain  RATIONALE FOR EVALUATION AND TREATMENT: Rehabilitation  THERAPY DIAG:  Other symptoms and signs involving the musculoskeletal system  Cramp and spasm  ONSET DATE: ~2 months  SUBJECTIVE:                                                                                                                                                                                            SUBJECTIVE STATEMENT: Pt reports he has not been twisting anymore with placing laundry in drawers.  PAIN:  Are you having pain?  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 Otherwise, unremarkable chest and abdominal radiographs.  SCREENING FOR RED FLAGS: Bowel or bladder incontinence: No Spinal tumors: No Cauda equina syndrome: No Compression fracture: No Abdominal aneurysm: No  COGNITION:  Overall cognitive status: Within functional limits for tasks assessed     SENSATION: WFL  MUSCLE LENGTH: Hamstrings: mild tight B ITB: mild tight B Piriformis: mild/mod tight B Hip flexors: mild tight L > R Quads: mild tight B  POSTURE:  increased lumbar lordosis and anterior pelvic tilt  PALPATION: TTP over R lower obliques  LUMBAR ROM:   Active  AROM  Eval 01/05/22  AROM 02/02/22  Flexion WFL WNL  Extension WFL * WNL *  Right lateral flexion WFL WNL  Left lateral flexion WFL WNL  Right rotation WFL WNL  Left rotation WFL * WNL   * - "pulling" noted  LOWER EXTREMITY MMT:    MMT  Right Eval 01/05/22 Left Eval 01/05/22  Right 02/02/22  Left 02/02/22  Hip flexion '4 4 5 ' 5-  Hip extension 4 4+ 4+ 4+  Hip abduction 4- 4 5- 5  Hip adduction 4 4+ 5 5  Hip internal rotation 4+ 4 5 4+  Hip external rotation 4 4 5- 4+  Knee flexion '5 4 5 5  ' Knee extension 5 4+'5 5 5  ' Ankle dorsiflexion 5 5    Ankle plantarflexion 5 5    Ankle inversion      Ankle eversion       (Blank rows = not tested)    TODAY'S TREATMENT  02/09/22 Therapeutic Exercise: Nustep L6x46mn LE only Squats 2x10 L  gastroc stretch on wall x 30 sec Fwd lunge with slider x 10 - 2 ski pole assist Standing lat pull with supinated grip 25# 2x10 Standing row 25# 2x10 Carrying 30lb box 2x for 90 ft   02/04/22 Therapeutic Exercise: Nustep L6x634m Standing shoulder extension 2x10 20# Standing row 25# 2x10 S/L clam + plank  GTB x 10 bil S/L fire hydrant GTB x 10 bil Quadruped fire hydrant x 10 Qaudruped hip extension with knee bent x 10  Therapeutic Activities: Squatting with 10lb DB starting with on top of counter placing on 9' stool to reciprocate placing clothes in drawer  Carrying 25lb box 2x for 90 ft   02/02/22 THERAPEUTIC EXERCISE: to improve flexibility, strength and mobility.  Verbal and tactile cues throughout for technique. Rec Bike - L5 x 6 min Sidelying R/L GTB clam 10 x 3" Side plank R/L GTB clam 10 x 3" Quadruped GTB fire hydrants 10 x 3" Bridge + GTB clam 10 x 3" Bridge + GTB alt single LE hip ABD/ER 10 x 3"  L/R SLS RDL reach to mat table 10 x 3" each LE L/R lunges with single UE support on back of chair 10 x 3" - pt noting increased pulling in R ankle related to recent ankle sprain R gastroc/soleus stretches 2 x 30"    PATIENT EDUCATION:  Education details: posture and body mechanics for typical daily postioning, mobility and household tasks  Person educated: Patient Education method: Customer service manager Education comprehension: verbalized understanding   HOME  EXERCISE PROGRAM: Access Code: [email or web address] URL: [email or web address] Date: 02/09/2022 Prepared by: Clarene Essex  Exercises - Supine Lower Trunk Rotation  - 2 x daily - 7 x weekly - 1-2 sets - 5 reps - 10 sec hold - Sidelying Thoracic Lumbar Rotation  - 2 x daily - 7 x weekly - 1-2 sets - 5 reps - 10 sec hold - Supine March with Resistance Band  - 2 x daily - 7 x weekly - 1-2 sets - 10 reps - 3 sec hold - Supine Single Leg Extensions  - 1 x daily - 7 x weekly - 2 sets - 10 reps - 3 sec hold - Standing Trunk Rotation with Resistance  - 1 x daily - 7 x weekly - 1-2 sets - 10 reps - Standing Bilateral Low Shoulder Row with Anchored Resistance  - 1 x daily - 7 x weekly - 2 sets - 10 reps - 5 sec hold - Scapular Retraction with Resistance Advanced  - 1 x daily - 7 x weekly - 2 sets - 10 reps - 5 sec hold - Supine Bicycles  - 1 x daily - 7 x weekly - 2 sets - 10 reps - Clam with Resistance  - 1 x daily - 3-4 x weekly - 2 sets - 10 reps - 3-5 sec hold - Side Plank with Clam and Resistance  - 1 x daily - 3 x weekly - 2 sets - 10 reps - 3 sec hold - Bridge with Hip Abduction and Resistance  - 1 x daily - 3-4 x weekly - 2 sets - 10 reps - 5 sec hold - The Diver  - 1 x daily - 3-4 x weekly - 2 sets - 10 reps - 3 sec hold - Standard Lunge  - 1 x daily - 3-4 x weekly - 2 sets - 10 reps - 3 sec hold  Patient Education - Posture and Body Mechanics   ASSESSMENT:  CLINICAL IMPRESSION: Pt has reported better compliance with ADLs specifically with twisting while placing items in drawers. Worked on hip strengthening with emphasis on core stabilization providing instruction with exercises to correct technique and form. Educated on posture and BM to allow for proper lifting mechanics to protect spine and abdomen.  OBJECTIVE IMPAIRMENTS decreased activity tolerance, decreased knowledge of condition, decreased mobility, decreased strength, increased fascial restrictions, impaired perceived  functional ability, increased muscle spasms, impaired flexibility, improper body mechanics, postural dysfunction, and pain.   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   REHAB POTENTIAL: Excellent  CLINICAL DECISION MAKING: Stable/uncomplicated  EVALUATION COMPLEXITY: Low   GOALS: Goals reviewed with patient? Yes  SHORT TERM GOALS: Target date: 01/26/2022  Patient will be independent with initial HEP. Baseline: Goal status: MET  01/19/22  LONG TERM GOALS: Target date: 02/16/2022  Patient will be independent with ongoing/advanced HEP +/- gym program for self-management at home. Baseline:  Goal status: IN PROGRESS  02/02/22 - Met for current HEP but updated today  2.  Improve understanding of body mechanics and spine care with  patient to verbalize and demo correct lifting techniques. Baseline:  Goal status: IN PROGRESS  3.  Patient to report reduction in frequency and intensity of R abdominal wall pain by >/= 50-75% to allow for improved activity tolerance. Baseline:  Goal status: MET  02/02/22 - 80% improvement in pain reported  4.  Patient to improve lumbar AROM to WNL without pain provocation. Baseline:  Goal status: IN PROGRESS  02/02/22 - Met except very mild pull noted in R LQ with lumbar extension  5.  Patient will demo strong contraction of TrA to stabilize pelvis during core work. Baseline:  Goal status: MET  02/02/22  6.  Patient will demonstrate improved B proximal LE strength to >/= 4+/5 for improved stability and ease of mobility . Baseline:  Goal status: MET  02/02/22  7.  Patient to report ability to perform  ADLs, household, and leisure activities without limitation due to R abdominal wall pain or weakness. Baseline:  Goal status: IN PROGRESS  02/02/22 - Mostly met - only noting slight discomfort noted when in odd positions such as leaning over while carrying something like a laundry basket   PLAN: PT FREQUENCY: 1-2x/week  PT DURATION: 6 weeks  PLANNED INTERVENTIONS: Therapeutic exercises, Therapeutic activity, Neuromuscular re-education, Balance training, Gait training, Patient/Family education, Joint mobilization, Dry Needling, Electrical stimulation, Cryotherapy, Moist heat, Ultrasound, Ionotophoresis 4m/ml Dexamethasone, Manual therapy, and Re-evaluation.  PLAN FOR NEXT SESSION:  review proper posture and body mechanics; clarify appropriate gym-based machines/exercises; progress core/proximal LE strengthening; MT +/- DN to address abnormal muscle tension as indicated    BArtist Pais PTA 02/09/2022, 10:46 AM

## 2022-02-11 ENCOUNTER — Ambulatory Visit: Payer: 59

## 2022-02-11 DIAGNOSIS — R252 Cramp and spasm: Secondary | ICD-10-CM

## 2022-02-11 DIAGNOSIS — R29898 Other symptoms and signs involving the musculoskeletal system: Secondary | ICD-10-CM | POA: Diagnosis not present

## 2022-02-11 NOTE — Therapy (Signed)
OUTPATIENT PHYSICAL THERAPY TREATMENT/PROGRESS NOTE   Patient Name: Shawn Mcknight MRN: [id number] DOB:1990/11/06, 31 y.o., male Today's Date: 02/11/2022   PT End of Session - 02/11/22 0933     Visit Number 10    Date for PT Re-Evaluation 02/16/22    Authorization Type [employer]    PT Start Time [phone]    PT Stop Time 0930    PT Time Calculation (min) 41 min    Activity Tolerance Patient tolerated treatment well    Behavior During Therapy [facility] for tasks assessed/performed                      Past Medical History:  Diagnosis Date   Closed fracture of lateral portion of left tibial plateau 11/25/2019   GERD (gastroesophageal reflux disease)    IBS (irritable bowel syndrome)    Lactose intolerance    Past Surgical History:  Procedure Laterality Date   ORIF TIBIA PLATEAU Left 11/26/2019   Procedure: OPEN REDUCTION INTERNAL FIXATION (ORIF) TIBIAL PLATEAU;  Surgeon: Marchia Bond, MD;  Location: [place];  Service: Orthopedics;  Laterality: Left;   WISDOM TOOTH EXTRACTION     Patient Active Problem List   Diagnosis Date Noted   Lower abdominal pain 07/01/2021   Hyperlipidemia 03/06/2021   Scalp pain 03/06/2021   Well adult exam 12/04/2020   Cutaneous abscess of abdominal wall 11/13/2020   Testicular pain 11/13/2020   Closed fracture of lateral portion of left tibial plateau 11/25/2019   NAFLD (nonalcoholic fatty liver disease) 10/25/2019   Class 3 severe obesity due to excess calories with serious comorbidity and body mass index (BMI) of 40.0 to 44.9 in adult [facility]) 10/25/2019   Elevated LFTs 07/03/2019   Ankle sprain 07/03/2019    PCP: Shelda Pal, DO  REFERRING PROVIDER: Shelda Pal, DO  REFERRING DIAG: R10.9 (ICD-10-CM) - Abdominal wall pain  RATIONALE FOR EVALUATION AND TREATMENT: Rehabilitation  THERAPY DIAG:  Other symptoms and signs involving the musculoskeletal system  Cramp and spasm  ONSET DATE: ~2 months  SUBJECTIVE:                                                                                                                                                                                            SUBJECTIVE STATEMENT: Doing good today.  PAIN:  Are you having pain?  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 Otherwise, unremarkable chest and abdominal radiographs.  SCREENING FOR RED FLAGS: Bowel or bladder incontinence: No Spinal tumors: No Cauda equina syndrome: No Compression fracture: No Abdominal aneurysm: No  COGNITION:  Overall cognitive status: Within functional limits for tasks assessed     SENSATION: WFL  MUSCLE LENGTH: Hamstrings: mild tight B ITB: mild tight B Piriformis: mild/mod tight B Hip flexors: mild tight L > R Quads: mild tight B  POSTURE:  increased lumbar lordosis and anterior pelvic tilt  PALPATION: TTP over R lower obliques  LUMBAR ROM:   Active  AROM  Eval 01/05/22  AROM 02/02/22 AROM 02/11/22  Flexion WFL WNL WNL  Extension WFL * WNL * WNL  Right lateral flexion WFL WNL WNL  Left lateral flexion WFL WNL WNL  Right rotation WFL WNL WNL  Left rotation WFL * WNL WNL- little pain   * - "pulling" noted  LOWER EXTREMITY MMT:    MMT  Right Eval 01/05/22 Left Eval 01/05/22  Right 02/02/22  Left 02/02/22  Hip flexion _0 5-  Hip extension 4 4+ 4+ 4+  Hip abduction 4- 4 5- 5  Hip adduction 4 4+ 5 5  Hip internal rotation 4+ 4 5 4+  Hip external rotation 4 4 5- 4+  Knee flexion _1 Knee extension 5 4+_2 Ankle dorsiflexion 5 5    Ankle plantarflexion 5 5    Ankle inversion      Ankle eversion       (Blank rows = not tested)    TODAY'S TREATMENT  02/11/22 Therapeutic Exercise: Nustep L6x1mn Fwd lunge with slider x 10 - 2 ski pole assist Side lunge with slider x 10 - 2 ski pole assist Deadlift with 5lb DB 10lb to mat table  Half kneeling trunk rotation with GTB x 10 Half kneeling pallof press with GTB x 10 TRX row x 10 TRX lat pull x 10 - mild pain in R abdomen - pain subsided when cued to decrease ROM   02/09/22 Therapeutic Exercise: Nustep L6x676m LE only Squats 2x10 L  gastroc stretch on wall x 30 sec Fwd lunge with slider x 10 - 2 ski pole assist Standing lat pull with supinated grip 25# 2x10 Standing row 25# 2x10 Carrying 30lb box 2x for 90 ft   02/04/22 Therapeutic Exercise: Nustep L6x6m18mStanding shoulder extension 2x10 20# Standing row 25# 2x10 S/L clam + plank  GTB x 10 bil S/L fire hydrant GTB x 10 bil Quadruped fire hydrant x 10 Qaudruped hip extension with knee bent x 10  Therapeutic Activities: Squatting with 10lb DB starting with on top of counter placing on 9' stool to reciprocate placing clothes in drawer  Carrying 25lb box 2x for 90 ft   02/02/22 THERAPEUTIC EXERCISE: to improve flexibility, strength and mobility.  Verbal and tactile cues throughout for technique. Rec Bike - L5 x 6 min Sidelying R/L GTB clam 10 x 3" Side plank R/L GTB  clam 10 x 3" Quadruped GTB fire hydrants 10 x 3" Bridge + GTB clam 10 x 3" Bridge + GTB alt single LE hip ABD/ER 10 x 3"  L/R SLS RDL reach to mat table 10 x 3" each LE L/R lunges with single UE support on back of chair 10 x 3" -  pt noting increased pulling in R ankle related to recent ankle sprain R gastroc/soleus stretches 2 x 30"    PATIENT EDUCATION:  Education details: posture and body mechanics for typical daily postioning, mobility and household tasks  Person educated: Patient Education method: Customer service manager Education comprehension: verbalized understanding   HOME EXERCISE PROGRAM: Access Code: [email or web address] URL: [email or web address] Date: 02/09/2022 Prepared by: Clarene Essex  Exercises - Supine Lower Trunk Rotation  - 2 x daily - 7 x weekly - 1-2 sets - 5 reps - 10 sec hold - Sidelying Thoracic Lumbar Rotation  - 2 x daily - 7 x weekly - 1-2 sets - 5 reps - 10 sec hold - Supine March with Resistance Band  - 2 x daily - 7 x weekly - 1-2 sets - 10 reps - 3 sec hold - Supine Single Leg Extensions  - 1 x daily - 7 x weekly - 2 sets - 10 reps - 3 sec hold - Standing Trunk Rotation with Resistance  - 1 x daily - 7 x weekly - 1-2 sets - 10 reps - Standing Bilateral Low Shoulder Row with Anchored Resistance  - 1 x daily - 7 x weekly - 2 sets - 10 reps - 5 sec hold - Scapular Retraction with Resistance Advanced  - 1 x daily - 7 x weekly - 2 sets - 10 reps - 5 sec hold - Supine Bicycles  - 1 x daily - 7 x weekly - 2 sets - 10 reps - Clam with Resistance  - 1 x daily - 3-4 x weekly - 2 sets - 10 reps - 3-5 sec hold - Side Plank with Clam and Resistance  - 1 x daily - 3 x weekly - 2 sets - 10 reps - 3 sec hold - Bridge with Hip Abduction and Resistance  - 1 x daily - 3-4 x weekly - 2 sets - 10 reps - 5 sec hold - The Diver  - 1 x daily - 3-4 x weekly - 2 sets - 10 reps - 3 sec hold - Standard Lunge  - 1 x daily - 3-4 x weekly - 2 sets - 10 reps - 3 sec hold  Patient Education - Posture and Body Mechanics   ASSESSMENT:  CLINICAL IMPRESSION: [place] is progressing with regard to lumbar ROM but he still notes mild pain with L rotation. We reviewed posture and body mechanics last  visit, he denied any concerns, has met LTG #2. Continued with progressing core stability and LE strengthening. Cues required with lat pulls on TRX to decrease ROM as this was causing pain - afterward patient noted pain going away. He still gets mild pain in R lower abdomen with positions where this area is placed on a stretch. He is able to squat and lift items w/o the pain in abdomen anymore. He seems ready to transition to HEP at this time but would benefit from review of HEP for understanding before D/C.   OBJECTIVE IMPAIRMENTS decreased activity tolerance, decreased knowledge of condition, decreased mobility, decreased strength, increased fascial restrictions, impaired perceived functional ability, increased muscle spasms, impaired flexibility, improper body mechanics, postural dysfunction, and pain.  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   REHAB POTENTIAL: Excellent  CLINICAL DECISION MAKING: Stable/uncomplicated  EVALUATION COMPLEXITY: Low   GOALS: Goals reviewed with patient? Yes  SHORT TERM GOALS: Target date: 01/26/2022  Patient will be independent with initial HEP. Baseline: Goal status: MET  01/19/22  LONG TERM GOALS: Target date: 02/16/2022  Patient will be independent with ongoing/advanced HEP +/- gym program for self-management at home. Baseline:  Goal status: IN PROGRESS  02/02/22 - Met for current HEP but updated today  2.  Improve understanding of body mechanics and spine care with patient to verbalize and demo correct lifting techniques. Baseline:  Goal status: MET - 02/11/22  3.  Patient to report reduction  in frequency and intensity of R abdominal wall pain by >/= 50-75% to allow for improved activity tolerance. Baseline:  Goal status: MET  02/02/22 - 80% improvement in pain reported  4.  Patient to improve lumbar AROM to WNL without pain provocation. Baseline:  Goal status: IN PROGRESS  - 02/11/22 (mild pain with   5.  Patient will demo strong contraction of TrA to stabilize pelvis during core work. Baseline:  Goal status: MET  02/02/22  6.  Patient will demonstrate improved B proximal LE strength to >/= 4+/5 for improved stability and ease of mobility . Baseline:  Goal status: MET  02/02/22  7.  Patient to report ability to perform ADLs, household, and leisure activities without limitation due to R abdominal wall pain or weakness. Baseline:  Goal status: IN PROGRESS  02/02/22 - Mostly met - only noting slight discomfort noted when in odd positions such as leaning over while carrying something like a laundry basket   PLAN: PT FREQUENCY: 1-2x/week  PT DURATION: 6 weeks  PLANNED INTERVENTIONS: Therapeutic exercises, Therapeutic activity, Neuromuscular re-education, Balance training, Gait training, Patient/Family education, Joint mobilization, Dry Needling, Electrical stimulation, Cryotherapy, Moist heat, Ultrasound, Ionotophoresis 46m/ml Dexamethasone, Manual therapy, and Re-evaluation.  PLAN FOR NEXT SESSION:  review HEP, plan for D/C, check remaining goals   BArtist Pais PTA 02/11/2022, 9:33 AM

## 2022-02-13 ENCOUNTER — Other Ambulatory Visit: Payer: Self-pay | Admitting: Family Medicine

## 2022-02-13 ENCOUNTER — Encounter: Payer: Self-pay | Admitting: Family Medicine

## 2022-02-13 ENCOUNTER — Ambulatory Visit (INDEPENDENT_AMBULATORY_CARE_PROVIDER_SITE_OTHER): Payer: 59 | Admitting: Family Medicine

## 2022-02-13 VITALS — BP 118/68 | HR 76 | Temp 98.3°F | Ht 65.0 in | Wt 269.4 lb

## 2022-02-13 DIAGNOSIS — Z0000 Encounter for general adult medical examination without abnormal findings: Secondary | ICD-10-CM

## 2022-02-13 DIAGNOSIS — H53122 Transient visual loss, left eye: Secondary | ICD-10-CM | POA: Diagnosis not present

## 2022-02-13 DIAGNOSIS — R635 Abnormal weight gain: Secondary | ICD-10-CM | POA: Diagnosis not present

## 2022-02-13 DIAGNOSIS — E785 Hyperlipidemia, unspecified: Secondary | ICD-10-CM

## 2022-02-13 DIAGNOSIS — Z136 Encounter for screening for cardiovascular disorders: Secondary | ICD-10-CM | POA: Diagnosis not present

## 2022-02-13 LAB — COMPREHENSIVE METABOLIC PANEL
ALT: 155 U/L — ABNORMAL HIGH (ref 0–53)
AST: 60 U/L — ABNORMAL HIGH (ref 0–37)
Albumin: 4.8 g/dL (ref 3.5–5.2)
Alkaline Phosphatase: 78 U/L (ref 39–117)
BUN: 13 mg/dL (ref 6–23)
CO2: 30 mEq/L (ref 19–32)
Calcium: 9.8 mg/dL (ref 8.4–10.5)
Chloride: 101 mEq/L (ref 96–112)
Creatinine, Ser: 0.79 mg/dL (ref 0.40–1.50)
GFR: 119.19 mL/min (ref >60.00)
Glucose, Bld: 95 mg/dL (ref 70–99)
Potassium: 4.3 mEq/L (ref 3.5–5.1)
Sodium: 139 mEq/L (ref 135–145)
Total Bilirubin: 0.7 mg/dL (ref 0.2–1.2)
Total Protein: 7.4 g/dL (ref 6.0–8.3)

## 2022-02-13 LAB — LDL CHOLESTEROL, DIRECT: Direct LDL: 152 mg/dL

## 2022-02-13 LAB — CBC
HCT: 45.4 % (ref 39.0–52.0)
Hemoglobin: 15.2 g/dL (ref 13.0–17.0)
MCHC: 33.6 g/dL (ref 30.0–36.0)
MCV: 86.9 fl (ref 78.0–100.0)
Platelets: 232 10*3/uL (ref 150.0–400.0)
RBC: 5.23 Mil/uL (ref 4.22–5.81)
RDW: 13.7 % (ref 11.5–15.5)
WBC: 6.9 10*3/uL (ref 4.0–10.5)

## 2022-02-13 LAB — LIPID PANEL
Cholesterol: 221 mg/dL — ABNORMAL HIGH (ref 0–200)
HDL: 39.6 mg/dL (ref >39.00)
NonHDL: 181.83
Total CHOL/HDL Ratio: 6
Triglycerides: 247 mg/dL — ABNORMAL HIGH (ref 0.0–149.0)
VLDL: 49.4 mg/dL — ABNORMAL HIGH (ref 0.0–40.0)

## 2022-02-13 LAB — TSH: TSH: 1.99 u[IU]/mL (ref 0.35–5.50)

## 2022-02-13 LAB — T4, FREE: Free T4: 0.9 ng/dL (ref 0.60–1.60)

## 2022-02-13 NOTE — Patient Instructions (Addendum)
Give [place] 2-3 business days to get the results of your labs back.   Keep the diet clean and stay active.  If you do not hear anything about your referral in the next 1-2 weeks, call our office and ask for an update.  Do monthly self testicular checks in the shower. You are feeling for lumps/bumps that don't belong. If you feel anything like this, let me know!  Please get me a copy of your advanced directive form at your convenience.   Your MRI is very reassuring. If you want to see a neurologist or start a medicine for migraines, please let me know.   Let us know if you need anything.  Healthy Eating Plan Many factors influence your heart health, including eating and exercise habits. Heart (coronary) risk increases with abnormal blood fat (lipid) levels. Heart-healthy meal planning includes limiting unhealthy fats, increasing healthy fats, and making other small dietary changes. This includes maintaining a healthy body weight to help keep lipid levels within a normal range.  WHAT IS MY PLAN?  Your health care provider recommends that you: Drink a glass of water before meals to help with satiety. Eat slowly. An alternative to the water is to add Metamucil. This will help with satiety as well. It does contain calories, unlike water.  WHAT TYPES OF FAT SHOULD I CHOOSE? Choose healthy fats more often. Choose monounsaturated and polyunsaturated fats, such as olive oil and canola oil, flaxseeds, walnuts, almonds, and seeds. Eat more omega-3 fats. Good choices include salmon, mackerel, sardines, tuna, flaxseed oil, and ground flaxseeds. Aim to eat fish at least two times each week. Avoid foods with partially hydrogenated oils in them. These contain trans fats. Examples of foods that contain trans fats are stick margarine, some tub margarines, cookies, crackers, and other baked goods. If you are going to avoid a fat, this is the one to avoid!  WHAT GENERAL GUIDELINES DO I NEED TO FOLLOW? Check  food labels carefully to identify foods with trans fats. Avoid these types of options when possible. Fill one half of your plate with vegetables and green salads. Eat 4-5 servings of vegetables per day. A serving of vegetables equals 1 cup of raw leafy vegetables,  cup of raw or cooked cut-up vegetables, or  cup of vegetable juice. Fill one fourth of your plate with whole grains. Look for the word "whole" as the first word in the ingredient list. Fill one fourth of your plate with lean protein foods. Eat 4-5 servings of fruit per day. A serving of fruit equals one medium whole fruit,  cup of dried fruit,  cup of fresh, frozen, or canned fruit. Try to avoid fruits in cups/syrups as the sugar content can be high. Eat more foods that contain soluble fiber. Examples of foods that contain this type of fiber are apples, broccoli, carrots, beans, peas, and barley. Aim to get 20-30 g of fiber per day. Eat more home-cooked food and less restaurant, buffet, and fast food. Limit or avoid alcohol. Limit foods that are high in starch and sugar. Avoid fried foods when able. Cook foods by using methods other than frying. Baking, boiling, grilling, and broiling are all great options. Other fat-reducing suggestions include: Removing the skin from poultry. Removing all visible fats from meats. Skimming the fat off of stews, soups, and gravies before serving them. Steaming vegetables in water or broth. Lose weight if you are overweight. Losing just 5-10% of your initial body weight can help your overall health and  prevent diseases such as diabetes and heart disease. Increase your consumption of nuts, legumes, and seeds to 4-5 servings per week. One serving of dried beans or legumes equals  cup after being cooked, one serving of nuts equals 1 ounces, and one serving of seeds equals  ounce or 1 tablespoon.  WHAT ARE GOOD FOODS CAN I EAT? Grains Grainy breads (try to find bread that is 3 g of fiber per slice or  greater), oatmeal, light popcorn. Whole-grain cereals. Rice and pasta, including brown rice and those that are made with whole wheat. Edamame pasta is a great alternative to grain pasta. It has a higher protein content. Try to avoid significant consumption of white bread, sugary cereals, or pastries/baked goods.  Vegetables All vegetables. Cooked white potatoes do not count as vegetables.  Fruits All fruits, but limit pineapple and bananas as these fruits have a higher sugar content.  Meats and Other Protein Sources Lean, well-trimmed beef, veal, pork, and lamb. Chicken and [place] without skin. All fish and shellfish. Wild duck, rabbit, pheasant, and venison. Egg whites or low-cholesterol egg substitutes. Dried beans, peas, lentils, and tofu. Seeds and most nuts.  Dairy Low-fat or nonfat cheeses, including ricotta, string, and mozzarella. Skim or 1% milk that is liquid, powdered, or evaporated. Buttermilk that is made with low-fat milk. Nonfat or low-fat yogurt. Soy/Almond milk are good alternatives if you cannot handle dairy.  Beverages Water is the best for you. Sports drinks with less sugar are more desirable unless you are a highly active athlete.  Sweets and Desserts Sherbets and fruit ices. Honey, jam, marmalade, jelly, and syrups. Dark chocolate.  Eat all sweets and desserts in moderation.  Fats and Oils Nonhydrogenated (trans-free) margarines. Vegetable oils, including soybean, sesame, sunflower, olive, peanut, safflower, corn, canola, and cottonseed. Salad dressings or mayonnaise that are made with a vegetable oil. Limit added fats and oils that you use for cooking, baking, salads, and as spreads.  Other Cocoa powder. Coffee and tea. Most condiments.  The items listed above may not be a complete list of recommended foods or beverages. Contact your dietitian for more options.

## 2022-02-13 NOTE — Progress Notes (Signed)
Chief Complaint  Patient presents with   Annual Exam    MRI results    Well Male Shawn Mcknight is here for a complete physical.   His last physical was >1 year ago.  Current diet: in general, diet could be better.   Current exercise: had stopped  Weight trend: increasing Fatigue out of ordinary? Yes associated with mood. Seat belt? Yes.   Advanced directive? No  Health maintenance Tetanus- Yes HIV- Yes Hep C- Yes  Obesity The patient has been having trouble losing weight.  Due to some concerns with his vision/head, he had stopped exercising and has gained more weight.  His diet could be much better.  In his early 73s, he was pleased with his weight but had several years of working in a job which affected his mental health and he gained a significant amount of weight within 3 years.  He has had trouble losing since then.  He has no interest in weight loss surgery or medication at this time.  He saw a nutritionist in the past while in [place] who had him on a relatively regimented eating plan.  Past Medical History:  Diagnosis Date   Closed fracture of lateral portion of left tibial plateau 11/25/2019   GERD (gastroesophageal reflux disease)    IBS (irritable bowel syndrome)    Lactose intolerance      Past Surgical History:  Procedure Laterality Date   ORIF TIBIA PLATEAU Left 11/26/2019   Procedure: OPEN REDUCTION INTERNAL FIXATION (ORIF) TIBIAL PLATEAU;  Surgeon: Marchia Bond, MD;  Location: [facility];  Service: Orthopedics;  Laterality: Left;   WISDOM TOOTH EXTRACTION      Medications  Current Outpatient Medications on File Prior to Visit  Medication Sig Dispense Refill   aspirin-acetaminophen-caffeine (EXCEDRIN MIGRAINE) 250-250-65 MG tablet Take 1 tablet by mouth every 6 (six) hours as needed for headache.     rosuvastatin (CRESTOR) 20 MG tablet Take 1 tablet (20 mg total) by mouth daily. 90 tablet 3   Allergies Allergies  Allergen Reactions   Shellfish Allergy Swelling     Lips swelling   Lactose Intolerance (Gi) Other (See Comments)    Bloating, gas    Family History Family History  Problem Relation Age of Onset   Hypertension Mother    Liver disease Mother        fatty liver   Healthy Father    Thyroid disease Sister    Thyroid cancer Sister    Esophageal cancer Maternal Grandmother    Stomach cancer Maternal Grandmother    Colon cancer Maternal Grandmother        [name] they removed some of the colon. Not sure if it small or large. but thinks it is the small   Colon polyps Neg Hx    Rectal cancer Neg Hx     Review of Systems: Constitutional: no fevers or chills Eye:  +intermittent visual changes Ear/Nose/Mouth/Throat:  Ears:  no hearing loss Nose/Mouth/Throat:  no complaints of nasal congestion, no sore throat Cardiovascular:  no chest pain Respiratory:  no shortness of breath Gastrointestinal:  no abdominal pain, no change in bowel habits GU:  Male: negative for dysuria Musculoskeletal/Extremities:  no pain of the joints Integumentary (Skin/Breast):  no abnormal skin lesions reported Neurologic:  no headaches Endocrine: No unexpected weight loss Hematologic/Lymphatic:  no night sweats  Exam BP 118/68   Pulse 76   Temp 98.3 F (36.8 C) (Oral)   Ht '5\' 5"'$  (1.651 m)   Wt 269 lb  6 oz (122.2 kg)   SpO2 96%   BMI 44.83 kg/m  General:  well developed, well nourished, in no apparent distress Skin:  no significant moles, warts, or growths Head:  no masses, lesions, or tenderness Eyes:  pupils equal and round, sclera anicteric without injection Ears:  canals without lesions, TMs shiny without retraction, no obvious effusion, no erythema Nose:  nares patent, septum midline, mucosa normal Throat/Pharynx:  lips and gingiva without lesion; tongue and uvula midline; non-inflamed pharynx; no exudates or postnasal drainage Neck: neck supple without adenopathy, thyromegaly, or masses Lungs:  clear to auscultation, breath sounds equal  bilaterally, no respiratory distress Cardio:  regular rate and rhythm, no bruits, no LE edema Abdomen:  abdomen soft, nontender; bowel sounds normal; no masses or organomegaly Genital (male): Deferred Rectal: Deferred Musculoskeletal:  symmetrical muscle groups noted without atrophy or deformity Extremities:  no clubbing, cyanosis, or edema, no deformities, no skin discoloration Neuro:  gait normal; deep tendon reflexes normal and symmetric Psych: well oriented with normal range of affect and appropriate judgment/insight  Assessment and Plan  Well adult exam - Plan: CBC, Comprehensive metabolic panel, Lipid panel  Scintillating scotoma of left eye  Morbid obesity (HCC) - Plan: Amb Ref to Medical Weight Management, Amb ref to Medical Nutrition Therapy-MNT  Weight gain - Plan: TSH, T4, free   Well 31 y.o. male. Counseled on diet and exercise. Self testicular exams recommended at least monthly.  Advanced directive form provided today.  Weight gain: Famhx of thyroid cancer, brother with high levels, will ck TSH/Free T4 Obesity: Healthy diet handout provided, refer dietician in addition to MWM team.  For his vision, it sounds like he has scintillating scotomas on the left.  Work-up unremarkable, offered migraine treatment versus referral to neurology which he politely declined at this time.  He will let me know if anything changes. Other orders as above. Follow up in 6 mo pending the above workup. The patient voiced understanding and agreement to the plan.  [place], DO 02/13/22 8:42 AM

## 2022-02-16 ENCOUNTER — Ambulatory Visit: Payer: 59 | Admitting: Physical Therapy

## 2022-02-16 ENCOUNTER — Encounter: Payer: Self-pay | Admitting: Physical Therapy

## 2022-02-16 DIAGNOSIS — R252 Cramp and spasm: Secondary | ICD-10-CM

## 2022-02-16 DIAGNOSIS — R29898 Other symptoms and signs involving the musculoskeletal system: Secondary | ICD-10-CM | POA: Diagnosis not present

## 2022-02-16 NOTE — Therapy (Addendum)
OUTPATIENT PHYSICAL THERAPY TREATMENT / PROGRESS NOTE / DISCHARGE SUMMARY   Patient Name: Shawn Mcknight MRN: [id number] DOB:06-12-1991, 31 y.o., male Today's Date: 02/16/2022  Progress Note  Reporting Period 01/05/2022 to 02/16/2022  See note below for Objective Data and Assessment of Progress/Goals.       PT End of Session - 02/16/22 0848     Visit Number 11    Date for PT Re-Evaluation 02/16/22    Authorization Type [employer]    PT Start Time 0848    PT Stop Time 0917    PT Time Calculation (min) 29 min    Activity Tolerance Patient tolerated treatment well    Behavior During Therapy [facility] for tasks assessed/performed               Past Medical History:  Diagnosis Date   Closed fracture of lateral portion of left tibial plateau 11/25/2019   GERD (gastroesophageal reflux disease)    IBS (irritable bowel syndrome)    Lactose intolerance    Past Surgical History:  Procedure Laterality Date   ORIF TIBIA PLATEAU Left 11/26/2019   Procedure: OPEN REDUCTION INTERNAL FIXATION (ORIF) TIBIAL PLATEAU;  Surgeon: Marchia Bond, MD;  Location: [facility];  Service: Orthopedics;  Laterality: Left;   WISDOM TOOTH EXTRACTION     Patient Active Problem List   Diagnosis Date Noted   Lower abdominal pain 07/01/2021   Hyperlipidemia 03/06/2021   Scalp pain 03/06/2021   Well adult exam 12/04/2020   Cutaneous abscess of abdominal wall 11/13/2020   Testicular pain 11/13/2020   Closed fracture of lateral portion of left tibial plateau 11/25/2019   NAFLD (nonalcoholic fatty liver disease) 10/25/2019   Morbid obesity ([place]) 10/25/2019   Elevated LFTs 07/03/2019   Ankle sprain 07/03/2019    PCP: Shelda Pal, DO  REFERRING PROVIDER: Shelda Pal, DO  REFERRING DIAG: R10.9 (ICD-10-CM) - Abdominal wall pain  RATIONALE FOR EVALUATION AND TREATMENT: Rehabilitation  THERAPY DIAG:  Other symptoms and signs involving the musculoskeletal system  Cramp and spasm  ONSET  DATE: ~2 months  SUBJECTIVE:                                                                                                                                                                                           SUBJECTIVE STATEMENT: Pt reports he was walking a lot over the weekend while trying to be conscious of his posture and abdominal muscle activation and now his abdominal muscles are a little sore.  PAIN:  Are you having pain?  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 Otherwise, unremarkable chest and abdominal radiographs.  SCREENING FOR RED FLAGS: Bowel or bladder incontinence: No Spinal tumors: No Cauda equina syndrome: No Compression fracture: No Abdominal aneurysm: No  COGNITION:  Overall cognitive status: Within functional limits for tasks assessed     SENSATION: WFL  MUSCLE LENGTH: Hamstrings: mild tight B ITB: mild tight B Piriformis: mild/mod tight B Hip flexors: mild tight L > R Quads: mild tight B  POSTURE:  increased lumbar lordosis and anterior pelvic tilt  PALPATION: TTP over R lower obliques  LUMBAR ROM:   Active  AROM  Eval 01/05/22  AROM 02/02/22  AROM 02/11/22  AROM 02/16/22  Flexion WFL WNL WNL WNL  Extension  WFL * WNL * WNL WNL  Right lateral flexion WFL WNL WNL WNL  Left lateral flexion WFL WNL WNL WNL  Right rotation WFL WNL WNL WNL  Left rotation WFL * WNL WNL- little pain WNL   * - "pulling" noted  LOWER EXTREMITY MMT:    MMT Right Eval 01/05/22 Left Eval 01/05/22  Right 02/02/22  Left 02/02/22  Right 02/16/22  Left 02/16/22  Hip flexion _0 5- 5 5  Hip extension 4 4+ 4+ 4+ 5- 5  Hip abduction 4- 4 5- _1 Hip adduction 4 4+ _2 Hip internal rotation 4+ 4 5 4+ 5 5  Hip external rotation 4 4 5- 4+ 5 5  Knee flexion _3 Knee extension 5 4+_4 Ankle dorsiflexion _5 Ankle plantarflexion _6 Ankle inversion        Ankle eversion         (Blank rows = not tested)    TODAY'S TREATMENT   02/16/22 THERAPEUTIC EXERCISE: to improve flexibility, strength and mobility.  Verbal and tactile cues throughout for technique. NuStep L6 x 6 min HEP review BATCA cable pull B pallof press 15# x 10 BATCA cable pull B trunk rotation 15# x 10 BATCA seated row w/o abdominal/chest pad to increase core muscle engagement   02/11/22 Therapeutic Exercise: Nustep L6x23mn Fwd lunge with slider x 10 - 2 ski pole assist Side lunge with slider x 10 - 2 ski pole assist Deadlift with 5lb DB 10lb to mat table  Half kneeling trunk rotation with GTB x 10 Half kneeling pallof press with GTB x 10 TRX row x 10 TRX lat pull x 10 - mild pain in R abdomen - pain subsided when cued to decrease ROM    02/09/22 Therapeutic Exercise: Nustep L6x616m LE only Squats 2x10 L  gastroc stretch on wall x 30 sec Fwd lunge with slider x 10 - 2 ski pole assist Standing lat pull with supinated  grip 25# 2x10 Standing row 25# 2x10 Carrying 30lb box 2x for 90 ft    PATIENT EDUCATION:  Education details: recommended frequency for ongoing HEP at discharge to prevent loss of gains achieved with PT  Person educated: Patient Education method: Customer service manager Education  comprehension: verbalized understanding and returned demonstration   HOME EXERCISE PROGRAM: Access Code: [email or web address] URL: [email or web address] Date: 02/09/2022 Prepared by: Clarene Essex  Exercises - Supine Lower Trunk Rotation  - 2 x daily - 7 x weekly - 1-2 sets - 5 reps - 10 sec hold - Sidelying Thoracic Lumbar Rotation  - 2 x daily - 7 x weekly - 1-2 sets - 5 reps - 10 sec hold - Supine March with Resistance Band  - 2 x daily - 7 x weekly - 1-2 sets - 10 reps - 3 sec hold - Supine Single Leg Extensions  - 1 x daily - 7 x weekly - 2 sets - 10 reps - 3 sec hold - Standing Trunk Rotation with Resistance  - 1 x daily - 7 x weekly - 1-2 sets - 10 reps - Standing Bilateral Low Shoulder Row with Anchored Resistance  - 1 x daily - 7 x weekly - 2 sets - 10 reps - 5 sec hold - Scapular Retraction with Resistance Advanced  - 1 x daily - 7 x weekly - 2 sets - 10 reps - 5 sec hold - Supine Bicycles  - 1 x daily - 7 x weekly - 2 sets - 10 reps - Clam with Resistance  - 1 x daily - 3-4 x weekly - 2 sets - 10 reps - 3-5 sec hold - Side Plank with Clam and Resistance  - 1 x daily - 3 x weekly - 2 sets - 10 reps - 3 sec hold - Bridge with Hip Abduction and Resistance  - 1 x daily - 3-4 x weekly - 2 sets - 10 reps - 5 sec hold - [place]  - 1 x daily - 3-4 x weekly - 2 sets - 10 reps - 3 sec hold - Standard Lunge  - 1 x daily - 3-4 x weekly - 2 sets - 10 reps - 3 sec hold  Patient Education - Posture and Body Mechanics   ASSESSMENT:  CLINICAL IMPRESSION: Kamran reports 95% improvement in his pain since the start of PT. He did note some muscle soreness after doing a lot of walking while trying to be conscious of his posture and core muscle activation - soreness likely muscle fatigue related, but denies pain. He now has full lumbar ROM w/o pain provocation and B LE strength now grossly 5/5. He denies any limitation with normal daily tasks at this point. HEP reviewed along with options for  similar exercises using gym equipment and weight machines. Tyrick feels comfortable with transitioning to HEP at this time but would like to remain on hold for 30-days in the event that issues arise necessitating a return to PT.  [place] decreased activity tolerance, decreased knowledge of condition, decreased mobility, decreased strength, increased fascial restrictions, impaired perceived functional ability, increased muscle spasms, impaired flexibility, improper body mechanics, postural dysfunction, and pain.   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  REHAB POTENTIAL: Excellent  CLINICAL DECISION MAKING: Stable/uncomplicated  EVALUATION COMPLEXITY: Low   GOALS: Goals reviewed with patient? Yes  SHORT TERM GOALS: Target date: 01/26/2022  Patient will be independent with initial HEP. Baseline: Goal status: MET  01/19/22  LONG TERM GOALS: Target date: 02/16/2022  Patient will be independent with ongoing/advanced HEP +/- gym program for self-management at home. Baseline:  Goal status: MET  02/16/22   2.  Improve understanding of body mechanics and spine care with patient to verbalize and demo correct lifting techniques. Baseline:  Goal status: MET  02/11/22  3.  Patient to report reduction in frequency and intensity of R abdominal wall pain by >/= 50-75% to allow for improved activity tolerance. Baseline:  Goal status: MET  02/16/22 - 95% improvement in pain reported  4.  Patient to improve lumbar AROM to WNL without pain provocation. Baseline:  Goal status: MET   02/16/22   5.  Patient will demo strong  contraction of TrA to stabilize pelvis during core work. Baseline:  Goal status: MET  02/02/22  6.  Patient will demonstrate improved B proximal LE strength to >/= 4+/5 for improved stability and ease of mobility . Baseline:  Goal status: MET  02/02/22  7.  Patient to report ability to perform ADLs, household, and leisure activities without limitation due to R abdominal wall pain or weakness. Baseline:  Goal status: MET  02/16/22    PLAN: PT FREQUENCY: 1-2x/week  PT DURATION: 6 weeks  PLANNED INTERVENTIONS: Therapeutic exercises, Therapeutic activity, Neuromuscular re-education, Balance training, Gait training, Patient/Family education, Joint mobilization, Dry Needling, Electrical stimulation, Cryotherapy, Moist heat, Ultrasound, Ionotophoresis 44m/ml Dexamethasone, Manual therapy, and Re-evaluation.  PLAN FOR NEXT SESSION:  transition to HEP + 30-day hold   JPercival Spanish PT 02/16/2022, 9:33 AM    PHYSICAL THERAPY DISCHARGE SUMMARY  Visits from Start of Care: 11  Current functional level related to goals / functional outcomes:   Refer to above clinical impression and goal assessment for status as of last visit on 02/16/22. Patient was placed on hold for 30 days and has not needed to return to PT, therefore will proceed with discharge from PT for this episode.     Remaining deficits:   As above.   Education / Equipment:   HEP   Patient agrees to discharge. Patient goals were met. Patient is being discharged due to meeting the stated rehab goals.  JPercival Spanish PT, MPT 03/24/22, 1:30 PM  [facility] [place] [address]Phone: [phone]  Fax:  [phone]

## 2022-03-23 ENCOUNTER — Other Ambulatory Visit: Payer: Self-pay | Admitting: Family Medicine

## 2022-03-23 ENCOUNTER — Encounter: Payer: Self-pay | Admitting: Family Medicine

## 2022-03-23 DIAGNOSIS — H539 Unspecified visual disturbance: Secondary | ICD-10-CM

## 2022-03-25 ENCOUNTER — Ambulatory Visit: Payer: 59 | Admitting: Family Medicine

## 2022-03-25 ENCOUNTER — Encounter: Payer: Self-pay | Admitting: Family Medicine

## 2022-03-25 VITALS — BP 110/76 | HR 88 | Temp 98.4°F | Ht 65.0 in | Wt 267.5 lb

## 2022-03-25 DIAGNOSIS — N5089 Other specified disorders of the male genital organs: Secondary | ICD-10-CM | POA: Diagnosis not present

## 2022-03-25 NOTE — Patient Instructions (Signed)
Someone will reach out in the next few days regarding the ultrasound.   Let us know if you need anything.

## 2022-03-25 NOTE — Progress Notes (Signed)
Chief Complaint  Patient presents with   Testicle Pain    Shawn Mcknight is a 31 y.o. male here for a skin complaint.  Duration: a few days Location: R testicular Pruritic? No Painful? No Drainage? No Denies trauma Other associated symptoms: feels like a hard nodule No color changes, fevers, urination issues.  Dad had a testicular cyst.   Past Medical History:  Diagnosis Date   Closed fracture of lateral portion of left tibial plateau 11/25/2019   GERD (gastroesophageal reflux disease)    IBS (irritable bowel syndrome)    Lactose intolerance     BP 110/76   Pulse 88   Temp 98.4 F (36.9 C) (Oral)   Ht '5\' 5"'$  (1.651 m)   Wt 267 lb 8 oz (121.3 kg)   SpO2 98%   BMI 44.51 kg/m  Gen: awake, alert, appearing stated age Lungs: No accessory muscle use GU: No gross deformity. Uncircumcised. L testicular unremarkable. Small cystic lesion on R epididymis. Small 0.2 cm raised lesion over mid body of R testicular. No hernia or ttp. No overlying erythema, hue change or ecchymosis. Psych: Age appropriate judgment and insight  Testicular lump - Plan: US Scrotum  Ck [place]. Could be cyst. +famhx of this.  F/u prn. The patient voiced understanding and agreement to the plan.  [place], DO 03/25/22 2:14 PM

## 2022-03-27 ENCOUNTER — Ambulatory Visit
Admission: RE | Admit: 2022-03-27 | Discharge: 2022-03-27 | Disposition: A | Discharge status: Home / Self Care | Payer: 59 | Source: Ambulatory Visit | Attending: Family Medicine | Admitting: Family Medicine

## 2022-03-27 DIAGNOSIS — N5089 Other specified disorders of the male genital organs: Secondary | ICD-10-CM

## 2022-03-31 ENCOUNTER — Other Ambulatory Visit (INDEPENDENT_AMBULATORY_CARE_PROVIDER_SITE_OTHER): Payer: 59

## 2022-03-31 DIAGNOSIS — E785 Hyperlipidemia, unspecified: Secondary | ICD-10-CM | POA: Diagnosis not present

## 2022-03-31 LAB — LIPID PANEL
Cholesterol: 137 mg/dL (ref 0–200)
HDL: 35.9 mg/dL — ABNORMAL LOW (ref >39.00)
LDL Cholesterol: 66 mg/dL (ref 0–99)
NonHDL: 101.55
Total CHOL/HDL Ratio: 4
Triglycerides: 177 mg/dL — ABNORMAL HIGH (ref 0.0–149.0)
VLDL: 35.4 mg/dL (ref 0.0–40.0)

## 2022-04-01 ENCOUNTER — Encounter: Payer: 59 | Attending: Family Medicine | Admitting: Skilled Nursing Facility1

## 2022-04-01 ENCOUNTER — Encounter: Payer: Self-pay | Admitting: Skilled Nursing Facility1

## 2022-04-01 DIAGNOSIS — E669 Obesity, unspecified: Secondary | ICD-10-CM | POA: Diagnosis present

## 2022-04-01 NOTE — Progress Notes (Signed)
Medical Nutrition Therapy  Appointment Start time:  9:00  Appointment End time:  10:00  Primary concerns today: to lose weight  Referral diagnosis: e66.09 Preferred learning style: auditory, visual Learning readiness:contemplating   NUTRITION ASSESSMENT    Clinical Medical Hx: GERD, IBS, sleep apnea, hypercholesterolemia  Medications: Excedrin migraine, rosuvastatin  Labs: triglycerides 177, HDL 35.90 Notable Signs/Symptoms: stress/anxiety  Lifestyle & Dietary Hx  Pt is allergic to shellfish and is lactose intolerant (can eat yogurt and cheese).  Pt states sometimes with romaine lettuce he will get diarrhea.  Pt states he has been steadily gaining weight.  Pt states he has learned he does stress eat. Pt states lately he has been getting more anxious.  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  Pt states after randomly losing vision in his eye he has had no answers which causes anxiety still having vision issues in his left eye. Pt states he does have a neurology appt.  Pt states he has been trying to increase the amount of water he drinks stating he avoids tap now because he does not know what caused his eye issue.  Pt states he works 8-5 and sometimes [place] time 11-8pm.  Pt states he has been sleeping more than usual due to his depression/anxiety.   Pt states he has a very supportive boyfriend of 8 years but he live sin [place].   Body Composition Scale 04/01/2022  Current Body Weight 266.2  Total Body Fat % 36.2  Visceral Fat 26  Fat-Free Mass % 63.7   Total Body Water % 44.7  Muscle-Mass lbs 50.2  BMI 44  Body Fat Displacement          Torso  lbs 59.9         Left Leg  lbs 11.9         Right Leg  lbs 11.9         Left Arm  lbs 5.9         Right Arm   lbs 5.9     Estimated daily fluid intake: unkown oz Supplements:  Sleep: inconsistent  Stress / self-care: high stress from fear of vision loss and work  Current average weekly physical activity:  ADL's  24-Hr Dietary Recall: eats out most meals First Meal: club sandwich from out Snack: chips Second Meal: eaten out Snack: chips Third Meal: eaten out Snack: popcorn Beverages: soda, water  NUTRITION INTERVENTION  Nutrition education (E-1) on the following topics:  Why physical activity is needed:  Physical activity has various benefits for the body such as reducing stress, helping control blood glucose levels, strengthening the heart, reducing incidence of cognitive impairment, and has a neuroprotective effect on memory function. Research reports that physical activity (whether it be aerobic, resistance training, or high intensity interval training) help control the level of reactive oxygen species which can lead to oxidative stress on the brain therefor possibly impairing memory, learning abilities, and other cognitive functions. The CDC recognizes that physical activity can help prevent premature death, may prevent the development of type 2 diabetes, different cancers, and heart disease. Not only are there are health benefits to physical activity, the CDC also reports that physical activity would decrease health care costs, increase property values, and people who are physically active generally have less sick days. Resource https://www-sciencedirect-com.libproxy.http://www.castillo-fisher.org/, CDC Importance of vegetables To have an overall healthy diet, adult men and women are recommended to consume anywhere from 2-3 cups of vegetables daily. Vegetables provide a wide range of  vitamins and minerals such as vitamin A, vitamin C, potassium, and folic acid. According to the [facility], including fruit and vegetables daily may reduce the risk of cardiovascular disease, certain cancers, and other non-communicable diseases. Purpose of hydration: Water makes up over 50% of your total body water, and is part of many organs throughout the body. Water is essential to  transport digested nutrients, regulate body temperature, rid the body of waste products, and protects joints and the spinal cord. When not properly hydrated you will begin to experience headaches, cramps and dizziness. Further dehydration can result in rapid heart rate, shock, oliguria, and may cause seizures.  https://www.merckmanuals.com/home/hormonal-and-metabolic-disordehttps://www.usgs.gov/special-topic/water-science-school/science/water-you-water-and-human-body?qt-science_center_objects=0#qt-science_center_objectsrs/water-balance/about-body-water HistoricalGrowth.gl https://www.stevens.org/ PimpTShirt.fi https://www.health.InvestmentBrowse.at Emotional eating and how to move through those feelings   Handouts Provided Include  Detailed MyPlate Should I eat Mindful meals  Learning Style & Readiness for Change Teaching method utilized: Visual & Auditory  Demonstrated degree of understanding via: Teach Back  Barriers to learning/adherence to lifestyle change: emotional eating  Goals Established by Pt Go swimming 3 days a week because you like it! Check your insurance for psychiatrist and talk therapist Make more meals from home Fill out the mindful meals sheet   MONITORING & EVALUATION Dietary intake, weekly physical activity  Next Steps  Patient is to return in 2 months.

## 2022-04-22 ENCOUNTER — Ambulatory Visit: Payer: 59 | Admitting: Skilled Nursing Facility1

## 2022-04-24 ENCOUNTER — Ambulatory Visit: Payer: 59 | Admitting: Diagnostic Neuroimaging

## 2022-04-24 ENCOUNTER — Encounter: Payer: Self-pay | Admitting: Diagnostic Neuroimaging

## 2022-04-24 VITALS — BP 110/84 | HR 87 | Ht 65.0 in | Wt 273.8 lb

## 2022-04-24 DIAGNOSIS — H47012 Ischemic optic neuropathy, left eye: Secondary | ICD-10-CM

## 2022-04-24 DIAGNOSIS — R0681 Apnea, not elsewhere classified: Secondary | ICD-10-CM | POA: Diagnosis not present

## 2022-04-24 DIAGNOSIS — R0683 Snoring: Secondary | ICD-10-CM | POA: Diagnosis not present

## 2022-04-24 NOTE — Progress Notes (Signed)
[facility]  PATIENT: Shawn Mcknight DOB: 05/30/1991  REFERRING CLINICIAN: Shelda Pal* HISTORY FROM: patient REASON FOR VISIT: new consult   HISTORICAL  CHIEF COMPLAINT:  Chief Complaint  Patient presents with   New Patient (Initial Visit)    Pt in room #6 and alone.    HISTORY OF PRESENT ILLNESS:   31 year old male here for evaluation of vision loss.  May 2023 patient had a coughing spell and then immediately had visual disturbance in the left eye.  He describes a patch of area out of his left eye in his inferior nasal field with hazy, blurred vision, pixelated vision, wavy lines.  Symptoms have been fairly stable since May 2023.  Went to eye doctor and then retina specialist.  Had visual field testing confirming this area of visual field loss only affecting the left eye, inferior nasal quadrant.  Patient did have some mild discomfort in his eye and head at the onset of symptoms.  Has had some issues with reading at times.  Funduscopic exam, retina exam have been unremarkable.  Had MRI of the brain and orbits which were reported as unremarkable.  Has some history of hyperlipidemia, on rosuvastatin.  Has some apnea, snoring, obesity issues, but has not had a sleep study.   REVIEW OF SYSTEMS: Full 14 system review of systems performed and negative with exception of: as per HPI.  ALLERGIES: Allergies  Allergen Reactions   Shellfish Allergy Swelling    Lips swelling   Lactose Intolerance (Gi) Other (See Comments)    Bloating, gas    HOME MEDICATIONS: Outpatient Medications Prior to Visit  Medication Sig Dispense Refill   aspirin-acetaminophen-caffeine (EXCEDRIN MIGRAINE) 250-250-65 MG tablet Take 1 tablet by mouth every 6 (six) hours as needed for headache.     rosuvastatin (CRESTOR) 20 MG tablet Take 1 tablet (20 mg total) by mouth daily. 90 tablet 3   No facility-administered medications prior to visit.    PAST MEDICAL HISTORY: Past  Medical History:  Diagnosis Date   Closed fracture of lateral portion of left tibial plateau 11/25/2019   GERD (gastroesophageal reflux disease)    IBS (irritable bowel syndrome)    Lactose intolerance     PAST SURGICAL HISTORY: Past Surgical History:  Procedure Laterality Date   ORIF TIBIA PLATEAU Left 11/26/2019   Procedure: OPEN REDUCTION INTERNAL FIXATION (ORIF) TIBIAL PLATEAU;  Surgeon: Marchia Bond, MD;  Location: [facility];  Service: Orthopedics;  Laterality: Left;   WISDOM TOOTH EXTRACTION      FAMILY HISTORY: Family History  Problem Relation Age of Onset   Hypertension Mother    Liver disease Mother        fatty liver   Healthy Father    Thyroid disease Sister    Thyroid cancer Sister    Esophageal cancer Maternal Grandmother    Stomach cancer Maternal Grandmother    Colon cancer Maternal Grandmother        [name] they removed some of the colon. Not sure if it small or large. but thinks it is the small   Colon polyps Neg Hx    Rectal cancer Neg Hx     SOCIAL HISTORY: Social History   Socioeconomic History   Marital status: Single    Spouse name: Not on file   Number of children: Not on file   Years of education: Not on file   Highest education level: Not on file  Occupational History   Not on file  Tobacco Use  Smoking status: Never   Smokeless tobacco: Never  Vaping Use   Vaping Use: Never used  Substance and Sexual Activity   Alcohol use: No   Drug use: Never   Sexual activity: Yes  Other Topics Concern   Not on file  Social History Narrative   Not on file   Social Determinants of Health   Financial Resource Strain: Not on file  Food Insecurity: Not on file  Transportation Needs: Not on file  Physical Activity: Not on file  Stress: Not on file  Social Connections: Not on file  Intimate Partner Violence: Not on file     PHYSICAL EXAM  GENERAL EXAM/CONSTITUTIONAL: Vitals:  Vitals:   04/24/22 0915  BP: 110/84  Pulse: 87  Weight: 273 lb  12.8 oz (124.2 kg)  Height: '5\' 5"'$  (1.651 m)   Body mass index is 45.56 kg/m. Wt Readings from Last 3 Encounters:  04/24/22 273 lb 12.8 oz (124.2 kg)  04/01/22 266 lb 3.2 oz (120.7 kg)  03/25/22 267 lb 8 oz (121.3 kg)   Patient is in no distress; well developed, nourished and groomed; neck is supple  CARDIOVASCULAR: Examination of carotid arteries is normal; no carotid bruits Regular rate and rhythm, no murmurs Examination of peripheral vascular system by observation and palpation is normal  EYES: Ophthalmoscopic exam of optic discs and posterior segments is normal; no papilledema or hemorrhages No results found.  MUSCULOSKELETAL: Gait, strength, tone, movements noted in Neurologic exam below  NEUROLOGIC: MENTAL STATUS:      No data to display         awake, alert, oriented to person, place and time recent and remote memory intact normal attention and concentration language fluent, comprehension intact, naming intact fund of knowledge appropriate  CRANIAL NERVE:  2nd - no papilledema on fundoscopic exam 2nd, 3rd, 4th, 6th - pupils equal and reactive to light, visual fields full to confrontation, extraocular muscles intact, no nystagmus 5th - facial sensation symmetric 7th - facial strength symmetric 8th - hearing intact 9th - palate elevates symmetrically, uvula midline 11th - shoulder shrug symmetric 12th - tongue protrusion midline  MOTOR:  normal bulk and tone, full strength in the BUE, BLE  SENSORY:  normal and symmetric to light touch, temperature, vibration  COORDINATION:  finger-nose-finger, fine finger movements normal  REFLEXES:  deep tendon reflexes present and symmetric  GAIT/STATION:  narrow based gait     DIAGNOSTIC DATA (LABS, IMAGING, TESTING) - I reviewed patient records, labs, notes, testing and imaging myself where available.  Lab Results  Component Value Date   WBC 6.9 02/13/2022   HGB 15.2 02/13/2022   HCT 45.4 02/13/2022    MCV 86.9 02/13/2022   PLT 232.0 02/13/2022      Component Value Date/Time   NA 139 02/13/2022 0849   K 4.3 02/13/2022 0849   CL 101 02/13/2022 0849   CO2 30 02/13/2022 0849   GLUCOSE 95 02/13/2022 0849   BUN 13 02/13/2022 0849   CREATININE 0.79 02/13/2022 0849   CREATININE 0.85 07/01/2021 0000   CALCIUM 9.8 02/13/2022 0849   PROT 7.4 02/13/2022 0849   ALBUMIN 4.8 02/13/2022 0849   AST 60 (H) 02/13/2022 0849   ALT 155 (H) 02/13/2022 0849   ALKPHOS 78 02/13/2022 0849   BILITOT 0.7 02/13/2022 0849   GFRNONAA 113 12/04/2020 0910   GFRAA 132 12/04/2020 0910   Lab Results  Component Value Date   CHOL 137 03/31/2022   HDL 35.90 (L) 03/31/2022   LDLCALC  66 03/31/2022   LDLDIRECT 152.0 02/13/2022   TRIG 177.0 (H) 03/31/2022   CHOLHDL 4 03/31/2022   No results found for: "HGBA1C" No results found for: "VITAMINB12" Lab Results  Component Value Date   TSH 1.99 02/13/2022    02/03/22 MRI brain:  Unremarkable MRI appearance of the brain. No evidence of acute intracranial abnormality.   02/03/22 MRI orbits [I reviewed images myself. Subtle T2 hyperintense signal within the posterior left optic nerve, series 3, image 17. -VRP]  1. Unremarkable MRI appearance of the orbits. No evidence of acute optic neuritis. No definite optic nerve atrophy. 2. Paranasal sinus disease, as described.    ASSESSMENT AND PLAN  31 y.o. year old male here with sudden onset partial visual loss in left eye, inferior nasal field, with subtle T2 hyperintense signal within the posterior left optic nerve, without enhancement.  Findings suspicious for posterior ischemic optic neuropathy.  We will proceed with further treatment and work-up.  Inflammatory optic neuritis also possible but less likely given sudden onset of symptoms, lack of improvement, lack of pain, lack of enhancement on imaging.   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  I reviewed images, labs, notes, records myself. I summarized findings and reviewed with patient, for this high risk condition (partial vision loss) requiring high complexity decision making.    Penni Bombard, MD 10/17/5571, 22:02 AM Certified in Neurology, Neurophysiology and Neuroimaging  [facility] [address] [place], [place] [address] [phone]

## 2022-04-24 NOTE — Patient Instructions (Addendum)
  POSTERIOR ISCHEMIC OPTIC NEUROPATHY (subtle changes on MRI orbits)  - start aspirin '81mg'$  daily  - continue rosuvastatin  - check carotid u/s, echocardioram, labs  - check sleep study (apnea, snoring, daytime sleepiness, obesity)

## 2022-04-25 LAB — C-REACTIVE PROTEIN: CRP: 9 mg/L (ref 0–10)

## 2022-04-25 LAB — SEDIMENTATION RATE: Sed Rate: 3 mm/hr (ref 0–15)

## 2022-04-25 LAB — HEMOGLOBIN A1C
Est. average glucose Bld gHb Est-mCnc: 117 mg/dL
Hgb A1c MFr Bld: 5.7 % — ABNORMAL HIGH (ref 4.8–5.6)

## 2022-04-27 ENCOUNTER — Ambulatory Visit (HOSPITAL_COMMUNITY)
Admission: RE | Admit: 2022-04-27 | Discharge: 2022-04-27 | Disposition: A | Discharge status: Home / Self Care | Payer: 59 | Source: Ambulatory Visit | Attending: Diagnostic Neuroimaging | Admitting: Diagnostic Neuroimaging

## 2022-04-27 DIAGNOSIS — H47012 Ischemic optic neuropathy, left eye: Secondary | ICD-10-CM

## 2022-04-27 NOTE — Progress Notes (Signed)
Carotid duplex has been completed.   Preliminary results in CV Proc.   Shawn Mcknight 04/27/2022 10:10 AM

## 2022-05-21 ENCOUNTER — Ambulatory Visit (HOSPITAL_COMMUNITY): Payer: 59 | Attending: Diagnostic Neuroimaging

## 2022-05-21 DIAGNOSIS — H47012 Ischemic optic neuropathy, left eye: Secondary | ICD-10-CM

## 2022-05-21 LAB — ECHOCARDIOGRAM COMPLETE BUBBLE STUDY
Area-P 1/2: 3.88 cm2
S' Lateral: 3.4 cm

## 2022-06-03 ENCOUNTER — Encounter: Payer: 59 | Attending: Family Medicine | Admitting: Skilled Nursing Facility1

## 2022-06-03 ENCOUNTER — Encounter: Payer: Self-pay | Admitting: Skilled Nursing Facility1

## 2022-06-03 NOTE — Progress Notes (Signed)
Medical Nutrition Therapy   Primary concerns today: to lose weight  Referral diagnosis: e66.09 Preferred learning style: auditory, visual Learning readiness:contemplating   NUTRITION ASSESSMENT    Clinical Medical Hx: GERD, IBS, sleep apnea, hypercholesterolemia  Medications: Excedrin migraine, rosuvastatin  Labs: triglycerides 177, HDL 35.90 Notable Signs/Symptoms: stress/anxiety  Lifestyle & Dietary Hx   Pt states he has a very supportive boyfriend of 8 years but he lives in [place].   Pt states he did a lot of tests to identify what could have caused his eye issues and does have a sleep study upcoming.  Pt states while in [place] he did a lot of walking and has kept up with walking with his friend for 10000 steps.  Pt states he is trying to go for walks by himself to reduce stress at work. Pt states he has been trying to cook more from home but sometimes just does not want to.  Pt states Tuesdays he is eating out due to to meetings.  Pt states in [place] his mom was teaching how to cook more dishes.  Pt states he is looking into a mental health professional in [place].   Dietitian encouraged pt to keep up his great changes with making more meals from home.     Body Composition Scale 04/01/2022 06/03/2022  Current Body Weight 266.2 269.1  Total Body Fat % 36.2 36.5  Visceral Fat 26 26  Fat-Free Mass % 63.7 63.4   Total Body Water % 44.7 44.4  Muscle-Mass lbs 50.2 50.7  BMI 44 44.4  Body Fat Displacement           Torso  lbs 59.9 61         Left Leg  lbs 11.9 12.2         Right Leg  lbs 11.9 12.2         Left Arm  lbs 5.9 6.1         Right Arm   lbs 5.9 6.1     Estimated daily fluid intake: unkown oz Supplements:  Sleep: inconsistent  Stress / self-care: high stress from fear of vision loss and work  Current average weekly physical activity: 3 days a week 10000 steps with a friend  24-Hr Dietary Recall: eats out most meals First Meal: club sandwich from  out Snack: chips Second Meal: rice and and meat or eaten out  Snack: chips Third Meal: risotto Snack: popcorn Beverages: soda, water  NUTRITION INTERVENTION  Nutrition education (E-1) on the following topics: Continued Why physical activity is needed:  Physical activity has various benefits for the body such as reducing stress, helping control blood glucose levels, strengthening the heart, reducing incidence of cognitive impairment, and has a neuroprotective effect on memory function. Research reports that physical activity (whether it be aerobic, resistance training, or high intensity interval training) help control the level of reactive oxygen species which can lead to oxidative stress on the brain therefor possibly impairing memory, learning abilities, and other cognitive functions. The CDC recognizes that physical activity can help prevent premature death, may prevent the development of type 2 diabetes, different cancers, and heart disease. Not only are there are health benefits to physical activity, the CDC also reports that physical activity would decrease health care costs, increase property values, and people who are physically active generally have less sick days. Resource https://www-sciencedirect-com.libproxy.http://www.castillo-fisher.org/, CDC Importance of vegetables To have an overall healthy diet, adult men and women are recommended to consume anywhere from 2-3 cups of vegetables  daily. Vegetables provide a wide range of vitamins and minerals such as vitamin A, vitamin C, potassium, and folic acid. According to the [facility], including fruit and vegetables daily may reduce the risk of cardiovascular disease, certain cancers, and other non-communicable diseases. Purpose of hydration: Water makes up over 50% of your total body water, and is part of many organs throughout the body. Water is essential to transport digested nutrients, regulate body temperature,  rid the body of waste products, and protects joints and the spinal cord. When not properly hydrated you will begin to experience headaches, cramps and dizziness. Further dehydration can result in rapid heart rate, shock, oliguria, and may cause seizures.  https://www.merckmanuals.com/home/hormonal-and-metabolic-disordehttps://www.usgs.gov/special-topic/water-science-school/science/water-you-water-and-human-body?qt-science_center_objects=0#qt-science_center_objectsrs/water-balance/about-body-water HistoricalGrowth.gl https://www.stevens.org/ PimpTShirt.fi https://www.health.InvestmentBrowse.at Emotional eating and how to move through those feelings  Educate dpt on grocery shopping and putting meals together   Handouts Provided Include  Detailed MyPlate Should I eat Mindful meals NEW: meal ideas sheet NEW: 21 meal ideas  Learning Style & Readiness for Change Teaching method utilized: Visual & Auditory  Demonstrated degree of understanding via: Teach Back  Barriers to learning/adherence to lifestyle change: emotional eating  Goals Established by Pt Continue: Go swimming 3 days a week because you like it! Continue: Check your insurance for psychiatrist and talk therapist Continue: Make more meals from home Continue: Fill out the mindful meals sheet   MONITORING & EVALUATION Dietary intake, weekly physical activity  Next Steps  Patient is to return as pt desires

## 2022-06-10 ENCOUNTER — Ambulatory Visit (INDEPENDENT_AMBULATORY_CARE_PROVIDER_SITE_OTHER): Payer: 59 | Admitting: Neurology

## 2022-06-10 ENCOUNTER — Encounter: Payer: Self-pay | Admitting: Neurology

## 2022-06-10 VITALS — BP 116/69 | HR 67 | Ht 65.0 in | Wt 271.6 lb

## 2022-06-10 DIAGNOSIS — R0683 Snoring: Secondary | ICD-10-CM | POA: Diagnosis not present

## 2022-06-10 DIAGNOSIS — R519 Headache, unspecified: Secondary | ICD-10-CM

## 2022-06-10 DIAGNOSIS — Z9189 Other specified personal risk factors, not elsewhere classified: Secondary | ICD-10-CM

## 2022-06-10 DIAGNOSIS — R0689 Other abnormalities of breathing: Secondary | ICD-10-CM

## 2022-06-10 DIAGNOSIS — G4719 Other hypersomnia: Secondary | ICD-10-CM

## 2022-06-10 DIAGNOSIS — H47012 Ischemic optic neuropathy, left eye: Secondary | ICD-10-CM

## 2022-06-10 NOTE — Progress Notes (Signed)
Subjective:    Patient ID: Shawn Mcknight is a 31 y.o. male.  HPI   Shawn Age, MD, PhD [facility] [address] [place], [place] [address]  Dear [name],   I saw your patient, Shawn Mcknight, upon your kind request, in my Sleep clinic today for initial consultation of his sleep disorder, in particular, concern for underlying obstructive sleep apnea.  The patient is unaccompanied today.  As you know, Shawn Mcknight is a 31 year old male with an underlying medical history of lactose intolerance, irritable bowel syndrome, reflux disease, closed fracture of left tibial plateau, left eye vision loss, with concern for ischemic optic neuropathy, and severe obesity with a BMI of over 45, who reports snoring and excessive daytime somnolence, as well as waking up with a sense of gasping for air.  I reviewed your office note from 04/24/2022.  His Epworth sleepiness score is 12 out of 24, fatigue severity score is 29 out of 63.  He has no family history of sleep apnea.  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  He has had significant weight gain over time, in the past 4 years he gained about 70 pounds.  He has some trouble sleeping at times.  He tried melatonin but 5 mg made him sleep too long and too groggy.  He recently got a new mattress.  He usually takes 0.5 mg when needed.  He has no TV in his bedroom, no pets in household, he is single, no kids.  He has had recurrent headaches in the past more so than lately.  He does not drink caffeine daily, he is a non-smoker and does not drink alcohol.  He denies night to night nocturia.  Work-up thus far has included a brain MRI with and without contrast, MR orbits with and without contrast, carotid Doppler ultrasound and echocardiogram.  His Past Medical History Is Significant For: Past Medical  History:  Diagnosis Date   Closed fracture of lateral portion of left tibial plateau 11/25/2019   GERD (gastroesophageal reflux disease)    IBS (irritable bowel syndrome)    Lactose intolerance    NAFL (nonalcoholic fatty liver)     His Past Surgical History Is Significant For: Past Surgical History:  Procedure Laterality Date   ORIF TIBIA PLATEAU Left 11/26/2019   Procedure: OPEN REDUCTION INTERNAL FIXATION (ORIF) TIBIAL PLATEAU;  Surgeon: Marchia Bond, MD;  Location: [facility];  Service: Orthopedics;  Laterality: Left;   WISDOM TOOTH EXTRACTION      His Family History Is Significant For: Family History  Problem Relation Mcknight of Onset   Thyroid cancer Mother    Hypertension Mother    Liver disease Mother        fatty liver   Healthy Father    Thyroid disease Sister    Thyroid cancer Sister    Esophageal cancer Maternal Grandmother    Stomach cancer Maternal Grandmother    Colon cancer Maternal Grandmother        [name] they removed some of the colon. Not sure if it small or large. but thinks it is the small   Heart attack Maternal Grandfather    Colon polyps Neg Hx    Rectal cancer Neg Hx     His Social History Is Significant For: Social History   Socioeconomic History   Marital status:  Single    Spouse name: Not on file   Number of children: Not on file   Years of education: Not on file   Highest education level: Not on file  Occupational History   Not on file  Tobacco Use   Smoking status: Never   Smokeless tobacco: Never  Vaping Use   Vaping Use: Never used  Substance and Sexual Activity   Alcohol use: No   Drug use: Never   Sexual activity: Yes  Other Topics Concern   Not on file  Social History Narrative   Caffiene - Sprite soda .    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    Bachelors degree      Social Determinants of Radio broadcast assistant Strain: Not on file  Food Insecurity: Not on file  Transportation Needs: Not on file   Physical Activity: Not on file  Stress: Not on file  Social Connections: Not on file    His Allergies Are:  Allergies  Allergen Reactions   Shellfish Allergy Swelling    Lips swelling   Lactose Intolerance (Gi) Other (See Comments)    Bloating, gas  :   His Current Medications Are:  Outpatient Encounter Medications as of 06/10/2022  Medication Sig   aspirin EC 81 MG tablet Take 81 mg by mouth daily. Swallow whole.   rosuvastatin (CRESTOR) 20 MG tablet Take 1 tablet (20 mg total) by mouth daily.   [DISCONTINUED] aspirin-acetaminophen-caffeine (EXCEDRIN MIGRAINE) 250-250-65 MG tablet Take 1 tablet by mouth every 6 (six) hours as needed for headache.   No facility-administered encounter medications on file as of 06/10/2022.  :   Review of Systems:  Out of a complete 14 point review of systems, all are reviewed and negative with the exception of these symptoms as listed below:  Review of Systems  Neurological:        Snoring and awakens gasping for air, ESS 12, FSS 29.  Some vision loss L eye - per eye doctor related to possible sleep apnea.    Objective:  Neurological Exam  Physical Exam Physical Examination:   Vitals:   06/10/22 0839  BP: 116/69  Pulse: 67    General Examination: The patient is a very pleasant 31 y.o. male in no acute distress. He appears well-developed and well-nourished and well groomed.   HEENT: Normocephalic, atraumatic, pupils are equal, round and reactive to light, extraocular tracking is good without limitation to gaze excursion or nystagmus noted. Hearing is grossly intact. Face is symmetric with normal facial animation. Speech is clear with no dysarthria noted. There is no hypophonia. There is no lip, neck/head, jaw or voice tremor. Neck is supple with full range of passive and active motion. There are no carotid bruits on auscultation. Oropharynx exam reveals: mild mouth dryness, good dental hygiene and moderate airway crowding, due to small  airway entry, slightly elongated uvula.  Small tonsils noted, Mallampati class II.  Minimal to mild overbite.  Tongue protrudes centrally and palate elevates symmetrically, neck circumference of 17 5/8 inches.  Chest: Clear to auscultation without wheezing, rhonchi or crackles noted.  Heart: S1+S2+0, regular and normal without murmurs, rubs or gallops noted.   Abdomen: Soft, non-tender and non-distended.  Extremities: There is no pitting edema in the distal lower extremities bilaterally.   Skin: Warm and dry without trophic changes noted.   Musculoskeletal: exam reveals no obvious joint deformities.   Neurologically:  Mental status: The patient is awake, alert and oriented  in all 4 spheres. His immediate and remote memory, attention, language skills and fund of knowledge are appropriate. There is no evidence of aphasia, agnosia, apraxia or anomia. Speech is clear with normal prosody and enunciation. Thought process is linear. Mood is normal and affect is normal.  Cranial nerves II - XII are as described above under HEENT exam.  Motor exam: Normal bulk, strength and tone is noted. There is no obvious action or resting tremor.  Fine motor skills and coordination: grossly intact.  Cerebellar testing: No dysmetria or intention tremor. There is no truncal or gait ataxia.  Sensory exam: intact to light touch in the upper and lower extremities.  Gait, station and balance: He stands easily. No veering to one side is noted. No leaning to one side is noted. Posture is Mcknight-appropriate and stance is narrow based. Gait shows normal stride length and normal pace. No problems turning are noted.   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 A laboratory attended sleep study is considered gold standard for evaluation of sleep disordered breathing and is recommended at this time and clinically justified.   I had a long chat with the patient about my findings and the diagnosis of sleep apnea, particularly OSA, its prognosis and treatment options. We talked about medical/conservative treatments, surgical interventions and non-pharmacological approaches for symptom control. I explained, in particular, the risks and ramifications of untreated moderate to severe OSA, especially with respect to developing cardiovascular disease down the road, including congestive heart failure (CHF), difficult to treat hypertension, cardiac arrhythmias (particularly A-fib), neurovascular complications including TIA, stroke and dementia. Even type 2 diabetes has, in part, been linked to untreated OSA. Symptoms of untreated OSA may include (but may not be limited to) daytime sleepiness, nocturia (i.e. frequent nighttime urination), memory problems, mood irritability and suboptimally controlled or worsening mood disorder such as depression and/or anxiety, lack of energy, lack of motivation, physical discomfort, as well as recurrent headaches, especially morning or nocturnal headaches. We talked about the importance of maintaining a healthy lifestyle and striving for healthy weight. In addition, we talked about the importance of striving for and maintaining good sleep hygiene. I recommended the following at this time: sleep study.  I outlined the differences between a laboratory attended sleep study which is considered more comprehensive and  accurate over the option of a home sleep test (HST); the latter may lead to underestimation of sleep disordered breathing in some instances and does not help with diagnosing upper airway resistance syndrome and is not accurate enough to diagnose primary central sleep apnea typically. I explained the different sleep test procedures to the patient in detail and also outlined possible surgical and non-surgical treatment options of OSA, including the use of a pressure airway pressure (PAP) device (ie CPAP, AutoPAP/APAP or BiPAP in certain circumstances), a custom-made dental device (aka oral appliance, which would require a referral to a specialist dentist or orthodontist typically, and is generally speaking not considered a good  choice for patients with full dentures or edentulous state), upper airway surgical options, such as traditional UPPP (which is not considered a first-line treatment) or the Inspire device (hypoglossal nerve stimulator, which would involve a referral for consultation with an ENT surgeon, after careful selection, following inclusion criteria). I explained the PAP treatment option to the patient in detail, as this is generally considered first-line treatment.  The patient indicated that he would be willing to try PAP therapy, if the need arises. I explained the importance of being compliant with PAP treatment, not only for insurance purposes but primarily to improve patient's symptoms symptoms, and for the patient's long term health benefit, including to reduce His cardiovascular risks longer-term.    We will pick up our discussion about the next steps and treatment options after testing.  We will keep him posted as to the test results by phone call and/or MyChart messaging where possible.  We will plan to follow-up in sleep clinic accordingly as well.  I answered all his questions today and the patient was in agreement.   I encouraged him to call with any interim questions, concerns,  problems or updates or email [place] through [place].  Generally speaking, sleep test authorizations may take up to 2 weeks, sometimes less, sometimes longer, the patient is encouraged to get in touch with [place] if they do not hear back from the sleep lab staff directly within the next 2 weeks.  Thank you very much for allowing me to participate in the care of this nice patient. If I can be of any further assistance to you please do not hesitate to talk to me.    Sincerely,   Shawn Age, MD, PhD

## 2022-06-10 NOTE — Patient Instructions (Signed)

## 2022-07-14 ENCOUNTER — Telehealth: Payer: Self-pay | Admitting: Neurology

## 2022-07-14 NOTE — Telephone Encounter (Signed)
[employer]- [employer] no Shawn Mcknight req spoke to [name] ref # [id number] chose  Patient is scheduled at [facility] for 09/22/22 at 9 pm.  Mailed packet to the patient.

## 2022-08-13 ENCOUNTER — Telehealth: Payer: Managed Care, Other (non HMO) | Admitting: Physician Assistant

## 2022-08-13 ENCOUNTER — Telehealth: Payer: Self-pay

## 2022-08-13 DIAGNOSIS — K529 Noninfective gastroenteritis and colitis, unspecified: Secondary | ICD-10-CM

## 2022-08-13 DIAGNOSIS — U071 COVID-19: Secondary | ICD-10-CM | POA: Diagnosis not present

## 2022-08-13 DIAGNOSIS — K5900 Constipation, unspecified: Secondary | ICD-10-CM

## 2022-08-13 DIAGNOSIS — K625 Hemorrhage of anus and rectum: Secondary | ICD-10-CM

## 2022-08-13 MED ORDER — BENZONATATE 100 MG PO CAPS: 100.0000 mg | ORAL_CAPSULE | Freq: Three times a day (TID) | ORAL | 0 refills | Status: DC | PRN

## 2022-08-13 MED ORDER — MOLNUPIRAVIR EUA 200MG CAPSULE: 4.0000 | ORAL_CAPSULE | Freq: Two times a day (BID) | ORAL | 0 refills | Status: AC

## 2022-08-13 NOTE — Patient Instructions (Signed)
Shawn Mcknight, thank you for joining Shawn Rio, PA-C for today's virtual visit.  While this provider is not your primary care provider (PCP), if your PCP is located in our provider database this encounter information will be shared with them immediately following your visit.   [facility] account gives you access to today's visit and all your visits, tests, and labs performed at [facility] " click here if you don't have a [facility] account or go to [email or web address]  Consent: (Patient) Shawn Mcknight provided verbal consent for this virtual visit at the beginning of the encounter.  Current Medications:  Current Outpatient Medications:    aspirin EC 81 MG tablet, Take 81 mg by mouth daily. Swallow whole., Disp: , Rfl:    rosuvastatin (CRESTOR) 20 MG tablet, Take 1 tablet (20 mg total) by mouth daily., Disp: 90 tablet, Rfl: 3   Medications ordered in this encounter:  No orders of the defined types were placed in this encounter.    *If you need refills on other medications prior to your next appointment, please contact your pharmacy*  Follow-Up: Call back or seek an in-person evaluation if the symptoms worsen or if the condition fails to improve as anticipated.  [place] [phone]  Other Instructions For constipation and blood when wiping. I encourage you to increase hydration and the amount of fiber in your diet.  Start a daily probiotic (Align, Culturelle, Digestive Advantage, etc.). If no bowel movement within 24 hours, take 2 Tbs of Milk of Magnesia in a 4 oz glass of warmed prune juice every 2-3 days to help promote bowel movement. If no results within 24 hours, then repeat above regimen, adding a Dulcolax stool softener to regimen. If this does not promote a smoother bowel movement, please follow-up in person. Ok to use the preparation H OTC. I do recommend use of sitz bath to help clean and soothe the area after  bowel movement.  Please keep well-hydrated and get plenty of rest. Start a saline nasal rinse to flush out your nasal passages. You can use plain Mucinex to help thin congestion. If you have a humidifier, running in the bedroom at night. I want you to start OTC vitamin D3 1000 units daily, vitamin C 1000 mg daily, and a zinc supplement. Please take prescribed medications as directed.  You have been enrolled in a MyChart symptom monitoring program. Please answer these questions daily so we can keep track of how you are doing.  You were to quarantine for 5 days from onset of your symptoms.  After day 5, if you have had no fever and you are feeling better, you can end quarantine but need to mask for an additional 5 days. After day 5 if you have a fever or are having significant symptoms, please quarantine for full 10 days.  If you note any worsening of symptoms, any significant shortness of breath or any chest pain, please seek ER evaluation ASAP.  Please do not delay care!  COVID-19: What to Do if You Are Sick If you test positive and are an older adult or someone who is at high risk of getting very sick from COVID-19, treatment may be available. Contact a healthcare provider right away after a positive test to determine if you are eligible, even if your symptoms are mild right now. You can also visit a Test to Treat location and, if eligible, receive a prescription from a provider. Don't delay: Treatment must be  started within the first few days to be effective. If you have a fever, cough, or other symptoms, you might have COVID-19. Most people have mild illness and are able to recover at home. If you are sick: Keep track of your symptoms. If you have an emergency warning sign (including trouble breathing), call 911. Steps to help prevent the spread of COVID-19 if you are sick If you are sick with COVID-19 or think you might have COVID-19, follow the steps below to care for yourself and to help  protect other people in your home and community. Stay home except to get medical care Stay home. Most people with COVID-19 have mild illness and can recover at home without medical care. Do not leave your home, except to get medical care. Do not visit public areas and do not go to places where you are unable to wear a mask. Take care of yourself. Get rest and stay hydrated. Take over-the-counter medicines, such as acetaminophen, to help you feel better. Stay in touch with your doctor. Call before you get medical care. Be sure to get care if you have trouble breathing, or have any other emergency warning signs, or if you think it is an emergency. Avoid public transportation, ride-sharing, or taxis if possible. Get tested If you have symptoms of COVID-19, get tested. While waiting for test results, stay away from others, including staying apart from those living in your household. Get tested as soon as possible after your symptoms start. Treatments may be available for people with COVID-19 who are at risk for becoming very sick. Don't delay: Treatment must be started early to be effective--some treatments must begin within 5 days of your first symptoms. Contact your healthcare provider right away if your test result is positive to determine if you are eligible. Self-tests are one of several options for testing for the virus that causes COVID-19 and may be more convenient than laboratory-based tests and point-of-care tests. Ask your healthcare provider or your local health department if you need help interpreting your test results. You can visit your state, tribal, local, and territorial health department's website to look for the latest local information on testing sites. Separate yourself from other people As much as possible, stay in a specific room and away from other people and pets in your home. If possible, you should use a separate bathroom. If you need to be around other people or animals in or  outside of the home, wear a well-fitting mask. Tell your close contacts that they may have been exposed to COVID-19. An infected person can spread COVID-19 starting 48 hours (or 2 days) before the person has any symptoms or tests positive. By letting your close contacts know they may have been exposed to COVID-19, you are helping to protect everyone. See COVID-19 and Animals if you have questions about pets. If you are diagnosed with COVID-19, someone from the health department may call you. Answer the call to slow the spread. Monitor your symptoms Symptoms of COVID-19 include fever, cough, or other symptoms. Follow care instructions from your healthcare provider and local health department. Your local health authorities may give instructions on checking your symptoms and reporting information. When to seek emergency medical attention Look for emergency warning signs* for COVID-19. If someone is showing any of these signs, seek emergency medical care immediately: Trouble breathing Persistent pain or pressure in the chest New confusion Inability to wake or stay awake Pale, gray, or blue-colored skin, lips, or nail beds, depending on  skin tone *This list is not all possible symptoms. Please call your medical provider for any other symptoms that are severe or concerning to you. Call 911 or call ahead to your local emergency facility: Notify the operator that you are seeking care for someone who has or may have COVID-19. Call ahead before visiting your doctor Call ahead. Many medical visits for routine care are being postponed or done by phone or telemedicine. If you have a medical appointment that cannot be postponed, call your doctor's office, and tell them you have or may have COVID-19. This will help the office protect themselves and other patients. If you are sick, wear a well-fitting mask You should wear a mask if you must be around other people or animals, including pets (even at home). Wear  a mask with the best fit, protection, and comfort for you. You don't need to wear the mask if you are alone. If you can't put on a mask (because of trouble breathing, for example), cover your coughs and sneezes in some other way. Try to stay at least 6 feet away from other people. This will help protect the people around you. Masks should not be placed on young children under age 40 years, anyone who has trouble breathing, or anyone who is not able to remove the mask without help. Cover your coughs and sneezes Cover your mouth and nose with a tissue when you cough or sneeze. Throw away used tissues in a lined trash can. Immediately wash your hands with soap and water for at least 20 seconds. If soap and water are not available, clean your hands with an alcohol-based hand sanitizer that contains at least 60% alcohol. Clean your hands often Wash your hands often with soap and water for at least 20 seconds. This is especially important after blowing your nose, coughing, or sneezing; going to the bathroom; and before eating or preparing food. Use hand sanitizer if soap and water are not available. Use an alcohol-based hand sanitizer with at least 60% alcohol, covering all surfaces of your hands and rubbing them together until they feel dry. Soap and water are the best option, especially if hands are visibly dirty. Avoid touching your eyes, nose, and mouth with unwashed hands. Handwashing Tips Avoid sharing personal household items Do not share dishes, drinking glasses, cups, eating utensils, towels, or bedding with other people in your home. Wash these items thoroughly after using them with soap and water or put in the dishwasher. Clean surfaces in your home regularly Clean and disinfect high-touch surfaces (for example, doorknobs, tables, handles, light switches, and countertops) in your "sick room" and bathroom. In shared spaces, you should clean and disinfect surfaces and items after each use by the  person who is ill. If you are sick and cannot clean, a caregiver or other person should only clean and disinfect the area around you (such as your bedroom and bathroom) on an as needed basis. Your caregiver/other person should wait as long as possible (at least several hours) and wear a mask before entering, cleaning, and disinfecting shared spaces that you use. Clean and disinfect areas that may have blood, stool, or body fluids on them. Use household cleaners and disinfectants. Clean visible dirty surfaces with household cleaners containing soap or detergent. Then, use a household disinfectant. Use a product from [employer] List N: Disinfectants for Coronavirus (NTIRW-43). Be sure to follow the instructions on the label to ensure safe and effective use of the product. Many products recommend keeping the  surface wet with a disinfectant for a certain period of time (look at "contact time" on the product label). You may also need to wear personal protective equipment, such as gloves, depending on the directions on the product label. Immediately after disinfecting, wash your hands with soap and water for 20 seconds. For completed guidance on cleaning and disinfecting your home, visit Complete Disinfection Guidance. Take steps to improve ventilation at home Improve ventilation (air flow) at home to help prevent from spreading COVID-19 to other people in your household. Clear out COVID-19 virus particles in the air by opening windows, using air filters, and turning on fans in your home. Use this interactive tool to learn how to improve air flow in your home. When you can be around others after being sick with COVID-19 Deciding when you can be around others is different for different situations. Find out when you can safely end home isolation. For any additional questions about your care, contact your healthcare provider or state or local health department. 10/15/2020 Content source: [facility]  Immunization and Respiratory Diseases ([facility]), Division of Viral Diseases This information is not intended to replace advice given to you by your health care provider. Make sure you discuss any questions you have with your health care provider. Document Revised: 11/28/2020 Document Reviewed: 11/28/2020 Elsevier Patient Education  2022 [employer].       If you have been instructed to have an in-person evaluation today at a local Urgent Care facility, please use the link below. It will take you to a list of all of our available [facility], including address, phone number and hours of operation. Please do not delay care.  [facility]  If you or a family member do not have a primary care provider, use the link below to schedule a visit and establish care. When you choose a [facility] primary care physician or advanced practice provider, you gain a long-term partner in health. Find a Primary Care Provider  Learn more about [facility]'s in-office and virtual care options: [facility]

## 2022-08-13 NOTE — Telephone Encounter (Signed)
BPA triggered for worsening symptoms Weakness. Patient called and he says his weakness started 2 days ago. He had a visit today, I asked is there anything different since the visit, he says no.Advised to follow the plan of the provider and I will send information in [place]. Patient verbalized understanding.     [facility] Mychart Covid-19 Condition Monitoring  Question 08/13/2022  4:01 PM EST - Filed by Patient  Are you feeling short of breath today? No  Are you having a cough today? Yes  Is the cough better, the same, or worse than yesterday? Same  Are you experiencing weakness today? Yes  Is the weakness better, the same, or worse than yesterday? New Abnormal   Are you vomiting? No  How is your appetite compared to yesterday? Same  Are you experiencing diarrhea? No

## 2022-08-13 NOTE — Progress Notes (Signed)
Virtual Visit Consent   Shawn Mcknight, you are scheduled for a virtual visit with a [facility] provider today. Just as with appointments in the office, your consent must be obtained to participate. Your consent will be active for this visit and any virtual visit you may have with one of our providers in the next 365 days. If you have a MyChart account, a copy of this consent can be sent to you electronically.  As this is a virtual visit, video technology does not allow for your provider to perform a traditional examination. This may limit your provider's ability to fully assess your condition. If your provider identifies any concerns that need to be evaluated in person or the need to arrange testing (such as labs, EKG, etc.), we will make arrangements to do so. Although advances in technology are sophisticated, we cannot ensure that it will always work on either your end or our end. If the connection with a video visit is poor, the visit may have to be switched to a telephone visit. With either a video or telephone visit, we are not always able to ensure that we have a secure connection.  By engaging in this virtual visit, you consent to the provision of healthcare and authorize for your insurance to be billed (if applicable) for the services provided during this visit. Depending on your insurance coverage, you may receive a charge related to this service.  I need to obtain your verbal consent now. Are you willing to proceed with your visit today? Chirag Krueger has provided verbal consent on 08/13/2022 for a virtual visit (video or telephone). Shawn Mcknight, [place]  Date: 08/13/2022 2:02 PM  Virtual Visit via Video Note   I, Shawn Mcknight, connected with  Shawn Mcknight  ([id number], 07-Jun-1991) on 08/13/22 at  1:30 PM EST by a video-enabled telemedicine application and verified that I am speaking with the correct person using two identifiers.  Location: Patient: Virtual Visit Location  Patient: Home Provider: Virtual Visit Location Provider: Home Office   I discussed the limitations of evaluation and management by telemedicine and the availability of in person appointments. The patient expressed understanding and agreed to proceed.    History of Present Illness: Shawn Mcknight is a 32 y.o. who identifies as a male who was assigned male at birth, and is being seen today for COVID-19. Notes these symptoms started yesterday with cough, congestion, nasal drainage, fatigue. No fever thus far. Denies chest pain or SOB. O2 at home is 94% on RA. Took home COVID test yesterday that was positive. Would like to discuss antivirals.  Patient also noting he is on course of Metronidazole from a doctor he saw while in [place] for gastroenteritis. He noted 1 day of loose stool and abdominal cramping. Low grade fever that resolved in that 1st day. Denies any testing done. Was given Metronidazole to take twice daily for 10 days. Stopped taking this as his symptoms had resolved quickly. Is unsure if still needed. Denies diarrhea, nausea, vomiting or cramping. Has started noting very firm stool in past day or so with straining. Today noting some BRB when wiping. Bought some preparation H in case of hemorrhoid but has not started using yet. Some slight rectal pain with BM.   HPI: HPI  Problems:  Patient Active Problem List   Diagnosis Date Noted   Lower abdominal pain 07/01/2021   Hyperlipidemia 03/06/2021   Scalp pain 03/06/2021   Well adult exam 12/04/2020   Cutaneous abscess of abdominal  wall 11/13/2020   Testicular pain 11/13/2020   Closed fracture of lateral portion of left tibial plateau 11/25/2019   NAFLD (nonalcoholic fatty liver disease) 10/25/2019   Morbid obesity ([place]) 10/25/2019   Elevated LFTs 07/03/2019   Ankle sprain 07/03/2019    Allergies:  Allergies  Allergen Reactions   Shellfish Allergy Swelling    Lips swelling   Lactose Intolerance ([place]) Other (See Comments)     Bloating, gas   Medications:  Current Outpatient Medications:    metroNIDAZOLE (FLAGYL) 500 MG tablet, Take 500 mg by mouth 2 (two) times daily., Disp: , Rfl:    aspirin EC 81 MG tablet, Take 81 mg by mouth daily. Swallow whole., Disp: , Rfl:    rosuvastatin (CRESTOR) 20 MG tablet, Take 1 tablet (20 mg total) by mouth daily., Disp: 90 tablet, Rfl: 3  Observations/Objective: Patient is well-developed, well-nourished in no acute distress.  Resting comfortably at home.  Head is normocephalic, atraumatic.  No labored breathing. Speech is clear and coherent with logical content.  Patient is alert and oriented at baseline.   Assessment and Plan: 1. COVID-19  Patient with multiple risk factors for complicated course of illness. Discussed risks/benefits of antiviral medications including most common potential ADRs. Patient voiced understanding and would like to proceed with antiviral medication. They are candidate for both Paxlovid and molnupiravir. Risks and benefits of each reviewed. He wants to proceed with Molnupiravir. Rx sent to pharmacy. Supportive measures, OTC medications and vitamin regimen reviewed. Tessalon per orders. Patient has been enrolled in a MyChart COVID symptom monitoring program. [place] Dry reviewed in detail. Strict ER precautions discussed with patient.   2. Gastroenteritis  Evaluated for this in [place]. Honestly seems to be a viral gastroenteritis with no need for antibiotic therapy. He has already stopped medication and giving resolution of initial symptoms, see no reason for him to restart.  3. Constipation, unspecified constipation type  New onset after recent gastroenteritis. Bowel regimen reviewed to help reset his bowel habits and ease in BM.  If not resolving or any new/worsening symptoms needs in-person assessment ASAP. Thankfully he has appt with PCP next week.  4. BRBPR (bright red blood per rectum)  Today only. Secondary to strain from constipation.  Possible development of small fissure or hemorrhoid. Bowel regimen reviewed. Supportive measures and OTC medications reviewed. Follow-up with PCP if not resolving within a few days or anything worsening.   Follow Up Instructions: I discussed the assessment and treatment plan with the patient. The patient was provided an opportunity to ask questions and all were answered. The patient agreed with the plan and demonstrated an understanding of the instructions.  A copy of instructions were sent to the patient via MyChart unless otherwise noted below.   The patient was advised to call back or seek an in-person evaluation if the symptoms worsen or if the condition fails to improve as anticipated.  Time:  I spent 25 minutes with the patient via telehealth technology discussing the above problems/concerns.    Shawn Rio, PA-C

## 2022-08-14 ENCOUNTER — Telehealth: Payer: Self-pay | Admitting: *Deleted

## 2022-08-14 ENCOUNTER — Encounter (INDEPENDENT_AMBULATORY_CARE_PROVIDER_SITE_OTHER): Payer: Self-pay

## 2022-08-14 NOTE — Telephone Encounter (Signed)
atient Responses  [facility] Mychart Covid-19 Condition Monitoring  Question 08/14/2022  2:15 PM EST - Filed by Patient  Are you feeling short of breath today? No  Are you having a cough today? Yes  Is the cough better, the same, or worse than yesterday? Worse Abnormal   Are you experiencing weakness today? Yes  Is the weakness better, the same, or worse than yesterday? Worse Abnormal   Are you vomiting? No  How is your appetite compared to yesterday? Same  Are you experiencing diarrhea? No

## 2022-08-14 NOTE — Telephone Encounter (Signed)
Best Practice Advice questionnaire for Covid triggered for worsening symptoms.  I called him in response to his responses.  He did a video visit yesterday and is on an antiviral for Covid.  His main complaint is the coughing and feeling very tired otherwise there are no changes in how he feels.  He asked if it was ok to take Advil because he is out of Tylenol.   I let him know that was ok.   He no longer has fever today.  No diarrhea or vomiting.  Denies shortness of breath or chest tightness.  He has some Delsym he is going to take for the cough.   I encouraged him to drink plenty of fluids.   Advised him that if he develops shortness of breath or chest tightness or feels like he is getting worse to go to the ED.    He verbalized understanding.    I advised him to continue following the plan the provider on his video visit provided for him.    He thanked me for calling.

## 2022-08-17 ENCOUNTER — Telehealth: Payer: Self-pay

## 2022-08-17 NOTE — Telephone Encounter (Signed)
Called patient in regard to Ages filled out on my chart. Left VM to call community line with any questions.

## 2022-08-19 ENCOUNTER — Encounter (HOSPITAL_COMMUNITY): Payer: Self-pay

## 2022-08-19 ENCOUNTER — Ambulatory Visit (HOSPITAL_COMMUNITY)
Admission: EM | Admit: 2022-08-19 | Discharge: 2022-08-19 | Disposition: A | Discharge status: Home / Self Care | Payer: Managed Care, Other (non HMO) | Attending: Family | Admitting: Family

## 2022-08-19 ENCOUNTER — Ambulatory Visit (INDEPENDENT_AMBULATORY_CARE_PROVIDER_SITE_OTHER): Payer: Managed Care, Other (non HMO)

## 2022-08-19 ENCOUNTER — Telehealth: Payer: Self-pay | Admitting: *Deleted

## 2022-08-19 ENCOUNTER — Telehealth: Payer: Self-pay | Admitting: Family Medicine

## 2022-08-19 DIAGNOSIS — R042 Hemoptysis: Secondary | ICD-10-CM

## 2022-08-19 DIAGNOSIS — U071 COVID-19: Secondary | ICD-10-CM

## 2022-08-19 DIAGNOSIS — Z8669 Personal history of other diseases of the nervous system and sense organs: Secondary | ICD-10-CM | POA: Diagnosis not present

## 2022-08-19 DIAGNOSIS — R051 Acute cough: Secondary | ICD-10-CM

## 2022-08-19 DIAGNOSIS — R059 Cough, unspecified: Secondary | ICD-10-CM

## 2022-08-19 DIAGNOSIS — R0789 Other chest pain: Secondary | ICD-10-CM

## 2022-08-19 DIAGNOSIS — J988 Other specified respiratory disorders: Secondary | ICD-10-CM

## 2022-08-19 MED ORDER — ALBUTEROL SULFATE HFA 108 (90 BASE) MCG/ACT IN AERS: 2.0000 | INHALATION_SPRAY | RESPIRATORY_TRACT | 0 refills | Status: DC | PRN

## 2022-08-19 NOTE — ED Provider Notes (Signed)
[place]    CSN: [id number] Arrival date & time: 08/19/22  1044      History   Chief Complaint Chief Complaint  Patient presents with  . Cough    I have covid since Jan 17th most things cleared but I started coughing blood - Entered by patient    HPI Shawn Mcknight is a 32 y.o. male.   32 year old male presents with worsening of cough over the past 2-3 days. Started with nasal congestion, cough, fatigue about 1 week ago. Did home COVID test 1 week ago and was positive. Had video visit with [facility] and prescribed Tessalon cough pills and placed on molnupirvavir for 5 days. Was improving 2 days ago but now cough is getting worse and last night and today have coughed up some blood-tinged mucus. Also having some nausea. No vomiting. No current fever. Some chest tightness.   The history is provided by the patient.  Cough   Past Medical History:  Diagnosis Date  . Closed fracture of lateral portion of left tibial plateau 11/25/2019  . GERD (gastroesophageal reflux disease)   . IBS (irritable bowel syndrome)   . Lactose intolerance   . NAFL (nonalcoholic fatty liver)     Patient Active Problem List   Diagnosis Date Noted  . Lower abdominal pain 07/01/2021  . Hyperlipidemia 03/06/2021  . Scalp pain 03/06/2021  . Well adult exam 12/04/2020  . Cutaneous abscess of abdominal wall 11/13/2020  . Testicular pain 11/13/2020  . Closed fracture of lateral portion of left tibial plateau 11/25/2019  . NAFLD (nonalcoholic fatty liver disease) 10/25/2019  . Morbid obesity ([place]) 10/25/2019  . Elevated LFTs 07/03/2019  . Ankle sprain 07/03/2019    Past Surgical History:  Procedure Laterality Date  . ORIF TIBIA PLATEAU Left 11/26/2019   Procedure: OPEN REDUCTION INTERNAL FIXATION (ORIF) TIBIAL PLATEAU;  Surgeon: Marchia Bond, MD;  Location: [facility];  Service: Orthopedics;  Laterality: Left;  . WISDOM TOOTH EXTRACTION         Home Medications    Prior to  Admission medications   Medication Sig Start Date End Date Taking? Authorizing Provider  rosuvastatin (CRESTOR) 20 MG tablet Take 1 tablet (20 mg total) by mouth daily. 12/09/21  Yes Shelda Pal, DO  aspirin EC 81 MG tablet Take 81 mg by mouth daily. Swallow whole.    [provider]  benzonatate (TESSALON) 100 MG capsule Take 1 capsule (100 mg total) by mouth 3 (three) times daily as needed for cough. 08/13/22   Brunetta Jeans, PA-C  metroNIDAZOLE (FLAGYL) 500 MG tablet Take 500 mg by mouth 2 (two) times daily.    [provider]    Family History Family History  Problem Relation Age of Onset  . Thyroid cancer Mother   . Hypertension Mother   . Liver disease Mother        fatty liver  . Healthy Father   . Thyroid disease Sister   . Thyroid cancer Sister   . Esophageal cancer Maternal Grandmother   . Stomach cancer Maternal Grandmother   . Colon cancer Maternal Grandmother        [name] they removed some of the colon. Not sure if it small or large. but thinks it is the small  . Heart attack Maternal Grandfather   . Colon polyps Neg Hx   . Rectal cancer Neg Hx     Social History Social History   Tobacco Use  . Smoking  status: Never  . Smokeless tobacco: Never  Vaping Use  . Vaping Use: Never used  Substance Use Topics  . Alcohol use: No  . Drug use: Never     Allergies   Shellfish allergy and Lactose intolerance (gi)   Review of Systems Review of Systems  Respiratory:  Positive for cough.      Physical Exam Triage Vital Signs ED Triage Vitals  Enc Vitals Group     BP 08/19/22 1237 130/80     Pulse Rate 08/19/22 1237 74     Resp 08/19/22 1237 16     Temp 08/19/22 1237 98 F (36.7 C)     Temp Source 08/19/22 1237 Oral     SpO2 08/19/22 1237 95 %     Weight --      Height --      Head Circumference --      Peak Flow --      Pain Score 08/19/22 1235 0     Pain Loc --      Pain Edu? --      Excl. in [place]? --    No data  found.  Updated Vital Signs BP 130/80 (BP Location: Right Arm)   Pulse 74   Temp 98 F (36.7 C) (Oral)   Resp 16   SpO2 95%   Visual Acuity Right Eye Distance:   Left Eye Distance:   Bilateral Distance:    Right Eye Near:   Left Eye Near:    Bilateral Near:     Physical Exam Vitals and nursing note reviewed.  HENT:     Head: Normocephalic and atraumatic.     Right Ear: Ear canal and external ear normal. Tympanic membrane is bulging.     Left Ear: Ear canal and external ear normal. Tympanic membrane is bulging.     Nose: Congestion present.     Right Sinus: No maxillary sinus tenderness.     Left Sinus: No maxillary sinus tenderness.     Mouth/Throat:     Lips: Pink.     Mouth: Mucous membranes are moist.     Pharynx: Oropharyngeal exudate and posterior oropharyngeal erythema present. No pharyngeal swelling or uvula swelling.     Comments: Slight clear to yellowish post nasal drainage present Cardiovascular:     Rate and Rhythm: Normal rate and regular rhythm.     Heart sounds: Normal heart sounds. No murmur heard. Pulmonary:     Effort: Pulmonary effort is normal.     Breath sounds: No rhonchi.  Skin:    General: Skin is warm and dry.     Findings: No rash.  Neurological:     Mental Status: He is alert.     UC Treatments / Results  Labs (all labs ordered are listed, but only abnormal results are displayed) Labs Reviewed - No data to display  EKG   Radiology No results found.  Procedures Procedures (including critical care time)  Medications Ordered in UC Medications - No data to display  Initial Impression / Assessment and Plan / UC Course  I have reviewed the triage vital signs and the nursing notes.  Pertinent labs & imaging results that were available during my care of the patient were reviewed by me and considered in my medical decision making (see chart for details).     *** Final Clinical Impressions(s) / UC Diagnoses   Final diagnoses:   None   Discharge Instructions   None    ED Prescriptions  None    PDMP not reviewed this encounter.

## 2022-08-19 NOTE — Telephone Encounter (Signed)
Called patient due to BPA responses for My Chart covid questionnaire. C/o worsening cough and blood noted in saliva after coughing. Reports not large amount of blood. Chest tightness noted with breathing in. Recommended contact PCP for earlier appt than Friday or go to UC for evaluation. If sx worsen or increased blood / clots noted go to ED for evaluation.

## 2022-08-19 NOTE — ED Triage Notes (Signed)
Pt is here for cough that has been getting worse. Pt now has a cough with blood  since today.

## 2022-08-19 NOTE — Discharge Instructions (Signed)
Your chest x-ray was negative and showed no pneumonia. Recommend start Albuterol inhaler 2 puffs every 4 to 6 hours as needed for cough/chest tightness. Recommend continue to push fluids to help loosen up mucus in sinuses and chest. Avoid antihistamines. There is also different opinions regarding taking Sudafed with history of glaucoma so at this time, recommend NOT to take Sudafed. There is also different opinions regarding Prednisone and glaucoma so will not prescribe at this time. Recommend talk to your PCP at your visit in 2 days regarding additional medication as needed for cough/symptoms. May continue OTC Delsym every 12 hours as needed for cough. Follow-up with your PCP in 2 days as planned.

## 2022-08-19 NOTE — Telephone Encounter (Signed)
Pt notified

## 2022-08-19 NOTE — Telephone Encounter (Signed)
Pt stated he has covid and has started to cough up blood and was recommended by another dr to f/u with pcp to see what he should do. He stated he wanted to know if he could wait until his appt on Friday or if he needed to go to UC. Heather made aware and advised pt we could see him today at 2:30 but he stated he had other things to do and would just go to UC.

## 2022-08-21 ENCOUNTER — Ambulatory Visit: Payer: Managed Care, Other (non HMO) | Admitting: Family Medicine

## 2022-08-21 ENCOUNTER — Encounter: Payer: Self-pay | Admitting: Family Medicine

## 2022-08-21 VITALS — BP 120/80 | HR 61 | Temp 98.3°F | Ht 65.0 in | Wt 265.4 lb

## 2022-08-21 DIAGNOSIS — E782 Mixed hyperlipidemia: Secondary | ICD-10-CM | POA: Diagnosis not present

## 2022-08-21 LAB — COMPREHENSIVE METABOLIC PANEL
ALT: 116 U/L — ABNORMAL HIGH (ref 0–53)
AST: 44 U/L — ABNORMAL HIGH (ref 0–37)
Albumin: 4.6 g/dL (ref 3.5–5.2)
Alkaline Phosphatase: 84 U/L (ref 39–117)
BUN: 14 mg/dL (ref 6–23)
CO2: 28 mEq/L (ref 19–32)
Calcium: 9.5 mg/dL (ref 8.4–10.5)
Chloride: 103 mEq/L (ref 96–112)
Creatinine, Ser: 0.7 mg/dL (ref 0.40–1.50)
GFR: 123.17 mL/min (ref >60.00)
Glucose, Bld: 102 mg/dL — ABNORMAL HIGH (ref 70–99)
Potassium: 4.2 mEq/L (ref 3.5–5.1)
Sodium: 140 mEq/L (ref 135–145)
Total Bilirubin: 0.6 mg/dL (ref 0.2–1.2)
Total Protein: 7.2 g/dL (ref 6.0–8.3)

## 2022-08-21 LAB — LIPID PANEL
Cholesterol: 125 mg/dL (ref 0–200)
HDL: 33.6 mg/dL — ABNORMAL LOW (ref >39.00)
LDL Cholesterol: 58 mg/dL (ref 0–99)
NonHDL: 91.02
Total CHOL/HDL Ratio: 4
Triglycerides: 167 mg/dL — ABNORMAL HIGH (ref 0.0–149.0)
VLDL: 33.4 mg/dL (ref 0.0–40.0)

## 2022-08-21 MED ORDER — ROSUVASTATIN CALCIUM 20 MG PO TABS: 20.0000 mg | ORAL_TABLET | Freq: Every day | ORAL | 3 refills | Status: DC

## 2022-08-21 NOTE — Patient Instructions (Addendum)
Keep the diet clean and stay active.  Give [place] 2-3 business days to get the results of your labs back.   Stay hydrated.  Take Metamucil or Benefiber daily.  Continue using Preparation H.   Keep the diet clean and stay active.  EXERCISES  RANGE OF MOTION (ROM) AND STRETCHING EXERCISES These exercises may help you when beginning to rehabilitate your injury. While completing these exercises, remember:  Restoring tissue flexibility helps normal motion to return to the joints. This allows healthier, less painful movement and activity. An effective stretch should be held for at least 30 seconds. A stretch should never be painful. You should only feel a gentle lengthening or release in the stretched tissue.  ROM - Pendulum Bend at the waist so that your right / left arm falls away from your body. Support yourself with your opposite hand on a solid surface, such as a table or a countertop. Your right / left arm should be perpendicular to the ground. If it is not perpendicular, you need to lean over farther. Relax the muscles in your right / left arm and shoulder as much as possible. Gently sway your hips and trunk so they move your right / left arm without any use of your right / left shoulder muscles. Progress your movements so that your right / left arm moves side to side, then forward and backward, and finally, both clockwise and counterclockwise. Complete 10-15 repetitions in each direction. Many people use this exercise to relieve discomfort in their shoulder as well as to gain range of motion. Repeat 2 times. Complete this exercise 3 times per week.  STRETCH - Flexion, Standing Stand with good posture. With an underhand grip on your right / left hand and an overhand grip on the opposite hand, grasp a broomstick or cane so that your hands are a little more than shoulder-width apart. Keeping your right / left elbow straight and shoulder muscles relaxed, push the stick with your opposite hand  to raise your right / left arm in front of your body and then overhead. Raise your arm until you feel a stretch in your right / left shoulder, but before you have increased shoulder pain. Try to avoid shrugging your right / left shoulder as your arm rises by keeping your shoulder blade tucked down and toward your mid-back spine. Hold 30 seconds. Slowly return to the starting position. Repeat 2 times. Complete this exercise 3 times per week.  STRETCH - Internal Rotation Place your right / left hand behind your back, palm-up. Throw a towel or belt over your opposite shoulder. Grasp the towel/belt with your right / left hand. While keeping an upright posture, gently pull up on the towel/belt until you feel a stretch in the front of your right / left shoulder. Avoid shrugging your right / left shoulder as your arm rises by keeping your shoulder blade tucked down and toward your mid-back spine. Hold 30. Release the stretch by lowering your opposite hand. Repeat 2 times. Complete this exercise 3 times per week.  STRETCH - External Rotation and Abduction Stagger your stance through a doorframe. It does not matter which foot is forward. As instructed by your physician, physical therapist or athletic trainer, place your hands: And forearms above your head and on the door frame. And forearms at head-height and on the door frame. At elbow-height and on the door frame. Keeping your head and chest upright and your stomach muscles tight to prevent over-extending your low-back, slowly shift your  weight onto your front foot until you feel a stretch across your chest and/or in the front of your shoulders. Hold 30 seconds. Shift your weight to your back foot to release the stretch. Repeat 2 times. Complete this stretch 3 times per week.   STRENGTHENING EXERCISES  These exercises may help you when beginning to rehabilitate your injury. They may resolve your symptoms with or without further involvement from your  physician, physical therapist or athletic trainer. While completing these exercises, remember:  Muscles can gain both the endurance and the strength needed for everyday activities through controlled exercises. Complete these exercises as instructed by your physician, physical therapist or athletic trainer. Progress the resistance and repetitions only as guided. You may experience muscle soreness or fatigue, but the pain or discomfort you are trying to eliminate should never worsen during these exercises. If this pain does worsen, stop and make certain you are following the directions exactly. If the pain is still present after adjustments, discontinue the exercise until you can discuss the trouble with your clinician. If advised by your physician, during your recovery, avoid activity or exercises which involve actions that place your right / left hand or elbow above your head or behind your back or head. These positions stress the tissues which are trying to heal.  STRENGTH - Scapular Depression and Adduction With good posture, sit on a firm chair. Supported your arms in front of you with pillows, arm rests or a table top. Have your elbows in line with the sides of your body. Gently draw your shoulder blades down and toward your mid-back spine. Gradually increase the tension without tensing the muscles along the top of your shoulders and the back of your neck. Hold for 3 seconds. Slowly release the tension and relax your muscles completely before completing the next repetition. After you have practiced this exercise, remove the arm support and complete it in standing as well as sitting. Repeat 2 times. Complete this exercise 3 times per week.   STRENGTH - External Rotators Secure a rubber exercise band/tubing to a fixed object so that it is at the same height as your right / left elbow when you are standing or sitting on a firm surface. Stand or sit so that the secured exercise band/tubing is at your  side that is not injured. Bend your elbow 90 degrees. Place a folded towel or small pillow under your right / left arm so that your elbow is a few inches away from your side. Keeping the tension on the exercise band/tubing, pull it away from your body, as if pivoting on your elbow. Be sure to keep your body steady so that the movement is only coming from your shoulder rotating. Hold 3 seconds. Release the tension in a controlled manner as you return to the starting position. Repeat 2 times. Complete this exercise 3 times per week.   STRENGTH - Supraspinatus Stand or sit with good posture. Grasp a 2-3 lb weight or an exercise band/tubing so that your hand is "thumbs-up," like when you shake hands. Slowly lift your right / left hand from your thigh into the air, traveling about 30 degrees from straight out at your side. Lift your hand to shoulder height or as far as you can without increasing any shoulder pain. Initially, many people do not lift their hands above shoulder height. Avoid shrugging your right / left shoulder as your arm rises by keeping your shoulder blade tucked down and toward your mid-back spine. Hold for  3 seconds. Control the descent of your hand as you slowly return to your starting position. Repeat 2 times. Complete this exercise 3 times per week.   STRENGTH - Shoulder Extensors Secure a rubber exercise band/tubing so that it is at the height of your shoulders when you are either standing or sitting on a firm arm-less chair. With a thumbs-up grip, grasp an end of the band/tubing in each hand. Straighten your elbows and lift your hands straight in front of you at shoulder height. Step back away from the secured end of band/tubing until it becomes tense. Squeezing your shoulder blades together, pull your hands down to the sides of your thighs. Do not allow your hands to go behind you. Hold for 3 seconds. Slowly ease the tension on the band/tubing as you reverse the directions and  return to the starting position. Repeat 2 times. Complete this exercise 3 times per week.   STRENGTH - Scapular Retractors Secure a rubber exercise band/tubing so that it is at the height of your shoulders when you are either standing or sitting on a firm arm-less chair. With a palm-down grip, grasp an end of the band/tubing in each hand. Straighten your elbows and lift your hands straight in front of you at shoulder height. Step back away from the secured end of band/tubing until it becomes tense. Squeezing your shoulder blades together, draw your elbows back as you bend them. Keep your upper arm lifted away from your body throughout the exercise. Hold 3 seconds. Slowly ease the tension on the band/tubing as you reverse the directions and return to the starting position. Repeat 2 times. Complete this exercise 3 times per week.  STRENGTH - Scapular Depressors Find a sturdy chair without wheels, such as a from a dining room table. Keeping your feet on the floor, lift your bottom from the seat and lock your elbows. Keeping your elbows straight, allow gravity to pull your body weight down. Your shoulders will rise toward your ears. Raise your body against gravity by drawing your shoulder blades down your back, shortening the distance between your shoulders and ears. Although your feet should always maintain contact with the floor, your feet should progressively support less body weight as you get stronger. Hold 3 seconds. In a controlled and slow manner, lower your body weight to begin the next repetition. Repeat 2 times. Complete this exercise 3 times per week.    This information is not intended to replace advice given to you by your health care provider. Make sure you discuss any questions you have with your health care provider.   Document Released: 05/27/2005 Document Revised: 08/03/2014 Document Reviewed: 10/25/2008 Elsevier Interactive Patient Education [employer].

## 2022-08-21 NOTE — Progress Notes (Signed)
Chief Complaint  Patient presents with   Follow-up    Had Covid last week Still coughing some blood Rectal discomfort/itching    Subjective: Hyperlipidemia Patient presents for Hyperlipidemia follow up. Currently taking Crestor 20 mg/d and compliance with treatment thus far has been good. He denies myalgias. He is sometimes adhering to a healthy diet. Exercise: walking No CP or SOB.  The patient is not known to have coexisting coronary artery disease.  Pt had covid last week. He is still coughing up blood. No wheezing, SOB, fevers, other URI s/s's, N/VD. Visual field impaired after coughing. Has an eye doctor.   Past Medical History:  Diagnosis Date   Closed fracture of lateral portion of left tibial plateau 11/25/2019   GERD (gastroesophageal reflux disease)    IBS (irritable bowel syndrome)    Lactose intolerance    NAFL (nonalcoholic fatty liver)     Objective: BP 120/80 (BP Location: Left Arm, Patient Position: Sitting, Cuff Size: Large)   Pulse 61   Temp 98.3 F (36.8 C) (Oral)   Ht '5\' 5"'$  (1.651 m)   Wt 265 lb 6 oz (120.4 kg)   SpO2 97%   BMI 44.16 kg/m  General: Awake, appears stated age HEENT: MMM Heart: RRR, no LE edema, no bruits Lungs: CTAB, no rales, wheezes or rhonchi. No accessory muscle use Psych: Age appropriate judgment and insight, normal affect and mood  Assessment and Plan: Mixed hyperlipidemia - Plan: Comprehensive metabolic panel, Lipid panel  Chronic, hopefully stable. Cont Crestor 20 mg/d. Counseled on diet/exercise.  F/u in 6 mo. The patient voiced understanding and agreement to the plan.  [place], DO 08/21/22  11:00 AM

## 2022-08-31 ENCOUNTER — Ambulatory Visit (INDEPENDENT_AMBULATORY_CARE_PROVIDER_SITE_OTHER): Payer: Managed Care, Other (non HMO) | Admitting: Neurology

## 2022-08-31 DIAGNOSIS — R0683 Snoring: Secondary | ICD-10-CM | POA: Diagnosis not present

## 2022-08-31 DIAGNOSIS — G4719 Other hypersomnia: Secondary | ICD-10-CM

## 2022-08-31 DIAGNOSIS — H47012 Ischemic optic neuropathy, left eye: Secondary | ICD-10-CM

## 2022-08-31 DIAGNOSIS — R0689 Other abnormalities of breathing: Secondary | ICD-10-CM

## 2022-08-31 DIAGNOSIS — Z9189 Other specified personal risk factors, not elsewhere classified: Secondary | ICD-10-CM

## 2022-08-31 DIAGNOSIS — R519 Headache, unspecified: Secondary | ICD-10-CM

## 2022-08-31 DIAGNOSIS — G4720 Circadian rhythm sleep disorder, unspecified type: Secondary | ICD-10-CM

## 2022-08-31 DIAGNOSIS — G4733 Obstructive sleep apnea (adult) (pediatric): Secondary | ICD-10-CM

## 2022-09-07 NOTE — Procedures (Unsigned)
Physician Interpretation:   Referred by: Dr. 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 His Epworth sleepiness score is 12 out of 24, fatigue severity score is 29 out of 63.     TITRATION DETAILS (SEE ALSO TABLE AT THE END OF THE REPORT):  The patient qualified for an emergency split sleep study per AASM standards. The baseline AHI was 73.6/h, and O2 nadir 63%.  The patient was shown several different interfaces and was subsequently fitted with a small fullface mask from [employer] ([employer]).  The patient was started on a pressure of 5 cm of water pressure and gradually titrated to a final titration pressure of 10 cm with a residual AHI of 7/h, with nonsupine REM sleep achieved, O2 nadir 88%.  He achieved a total sleep time of 68.5 minutes on the final titration pressure.  Snoring was improved.   EEG: Review of the EEG showed no abnormal electrical discharges and symmetrical bihemispheric findings.    EKG: The EKG revealed normal sinus rhythm (NSR).   AUDIO/VIDEO REVIEW: The audio and video review did not show any abnormal or unusual behaviors, movements, phonations or vocalizations. The patient took 2 restroom breaks. Snoring was noted, ranging from mild to loud in the baseline portion of the study, improved with PAP therapy.  POST-STUDY QUESTIONNAIRE: Post study, the patient indicated, that sleep was worse than usual.  IMPRESSION:   Severe obstructive Sleep Apnea (OSA) Dysfunctions associated with sleep stages or arousal from sleep  RECOMMENDATIONS:   This patient has severe obstructive sleep apnea. The patient  qualified for an emergency split sleep study per AASM standards. The baseline AHI was 73.6/h, and O2 nadir 63%.  The patient responded well to PAP therapy. CPAP of 10 cm resulted in significant reduction of his sleep disordered breathing but he still had a mildly elevated AHI and achieved normal supine REM sleep as well as a mild desaturation on the pressure of 10 cm which is why I would recommend a home treatment pressure of 11 cm via small fullface mask from [employer] or mask of choice, sized to fit. The patient will be advised to be fully compliant with PAP therapy to improve sleep related symptoms and decrease long term cardiovascular risks. Please note, that untreated obstructive sleep apnea may carry additional perioperative morbidity. Patients with significant obstructive sleep apnea should receive perioperative PAP therapy and the surgeons and particularly the anesthesiologist should be informed of the diagnosis and the severity of the sleep disordered breathing. This study shows sleep fragmentation and abnormal sleep stage percentages, particularly in the first half of the study before PAP therapy and better sleep consolidation during the treatment portion of the study. These are nonspecific findings and per se do not signify an intrinsic sleep disorder or a cause for the patient's sleep-related symptoms. Causes include (but are not limited to) the first night effect of the sleep study, circadian rhythm disturbances, medication effect or an underlying mood disorder or medical problem.  The patient should be cautioned not to drive, work at heights, or operate dangerous or heavy equipment when tired or sleepy. Review and reiteration of good sleep hygiene measures should be pursued with  any patient. The patient will be seen in follow-up in the sleep clinic at [facility] for discussion of the test results, symptom and treatment compliance review, further management strategies, etc. The referring provider will  be notified of the test results.  I certify that I have reviewed the entire raw data recording prior to the issuance of this report in accordance with the Standards of Accreditation of the American Academy of Sleep Medicine (AASM).   [name], MD, PhD Medical Director, [facility]) Diplomat, ABPN (Neurology and Sleep)   Technical Report:   ***  Titration Table:  ***

## 2022-09-08 ENCOUNTER — Telehealth: Payer: Self-pay | Admitting: *Deleted

## 2022-09-08 NOTE — Addendum Note (Signed)
Addended by: [name] on: 09/08/2022 09:17 AM   Modules accepted: Orders

## 2022-09-08 NOTE — Telephone Encounter (Signed)
-----   Message from [name], MD sent at 09/08/2022  9:17 AM EST ----- Patient referred by Dr. Leta Baptist, seen by me on 06/10/22, patient had a split night sleep study on 08/31/22. Please call and notify patient that the recent sleep study showed severe obstructive sleep apnea (OSA). He did well with CPAP during the study with significant improvement of the respiratory events. I would like start the patient on a new CPAP machine for home use. I placed the order in the chart.  Please advise patient that we will need a follow up appointment with either myself or one of our nurse practitioners in about 2-3 months post set-up to check for how they are doing on treatment and how well it's going with the machine in general. Most insurance company require a certain compliance percentage to continue to cover/pay for the machine. Please ask patient to schedule this FU appointment, according to the set-up date, which is the day they receive the machine. Please make sure, the patient understands the importance of keeping this window for the FU appointment, as it is often an Designer, industrial/product and not our rule. Failing to adhere to this may result in losing coverage for sleep apnea treatment, at which point some insurances require repeating the whole process. Plus, monitoring compliance data is usually good feedback for the patient as far as how they are doing, how many hours they are on it, how well the mask fits, etc.  Also remind patient, that any PAP machine or mask issues should be first addressed with the DME company, who provided the machine/supplies.  Please ask if patient has a preference regarding DME company, may depend on the insurance too.  [name], MD, PhD [facility])

## 2022-09-08 NOTE — Telephone Encounter (Signed)
Spoke with patient and discussed sleep study results. Patient is amenable to proceeding with cpap at home. Patient did not have a preference for DME company. He did mention that he might be moving out of state by the end of March. He will direct financial questions, etc to the DME company. We discussed the insurance compliance requirements which includes using the machine at least 4 hours at night and also being seen in our office for an initial follow-up appointment between 30 and 90 days after setup. Patient prefers for the appointment to be virtual. He does understand this would be possible as long as we can see his data online. He also understands that once he moves he eventually would want to get setup with local doctors. We planned to send referral to [place].

## 2022-09-08 NOTE — Telephone Encounter (Signed)
Referral for cpap sent to [facility] Mentioned pt will likely be moving next month.

## 2022-09-10 NOTE — Telephone Encounter (Signed)
[facility] confirmed receipt of order.

## 2022-10-13 NOTE — Telephone Encounter (Addendum)
Pt moving to [place] on 11/01/22. Unable to schedule Initial CPAP appt between 10/29/22-12/28/22. Would like a call from the nurse to discuss options for compliance for insurance purposes. Contact [phone].  Pt requested a MyChart Video Visit, was unable to schedule due to physician can not schedule MyChart Video Visit out of state.

## 2022-10-13 NOTE — Telephone Encounter (Signed)
Spoke with patient. I recommended he discuss with [facility] getting his DME care transferred to an [facility] in [place]. I also advised him to discuss with his insurance which [place] doctors are in network that can treat sleep. I was able to find an appt for him on 10/29/22 at 845 am arrival 815 w/ Dr Rexene Alberts. Patient was very Patent attorney. He as setup on 09/29/22 and started using the machine that same night. I highly encouraged him to make sure he is using it every night until his appointment so there are 30 days of data. Patient was appreciative and verbalized understanding.

## 2022-10-27 ENCOUNTER — Encounter: Payer: Managed Care, Other (non HMO) | Attending: Family Medicine | Admitting: Skilled Nursing Facility1

## 2022-10-27 ENCOUNTER — Encounter: Payer: Self-pay | Admitting: Skilled Nursing Facility1

## 2022-10-27 NOTE — Progress Notes (Signed)
Medical Nutrition Therapy   Primary concerns today: to lose weight  Referral diagnosis: e66.09 Preferred learning style: auditory, visual Learning readiness:contemplating   NUTRITION ASSESSMENT   Clinical Medical Hx: GERD, IBS, sleep apnea, hypercholesterolemia  Medications: Excedrin migraine, rosuvastatin  Labs: triglycerides 177, HDL 35.90 Notable Signs/Symptoms: stress/anxiety  Lifestyle & Dietary Hx  Pt states he got Covid after his trip to [place]. Pt states he was asked to move to [place] by his boss this Sunday. Dx with severe sleep apnea using his C-PPA nightly. Pt stated was trying to eat as best as possible during packing and moving. Pt stated he wants to find a new dietitian once moved in. Pt is concerned about what to focus on once moved and that he won't be able to maintain changes.     Body Composition Scale 04/01/2022 06/03/2022  Current Body Weight 266.2 269.1  Total Body Fat % 36.2 36.5  Visceral Fat 26 26  Fat-Free Mass % 63.7 63.4   Total Body Water % 44.7 44.4  Muscle-Mass lbs 50.2 50.7  BMI 44 44.4  Body Fat Displacement           Torso  lbs 59.9 61         Left Leg  lbs 11.9 12.2         Right Leg  lbs 11.9 12.2         Left Arm  lbs 5.9 6.1         Right Arm   lbs 5.9 6.1     Estimated daily fluid intake: unkown oz Supplements: unknown Sleep: inconsistent  Stress / self-care: high stress from fear of vision loss and work  Current average weekly physical activity: 3 days a week 10000 steps with a friend  24-Hr Dietary Recall: eats out most meals First Meal: club sandwich from out Snack: chips Second Meal: rice and and meat or eaten out  Snack: chips Third Meal: risotto Snack: popcorn Beverages: soda, water  NUTRITION INTERVENTION  Nutrition education (E-1) on the following topics: Continued Why physical activity is needed:  Physical activity has various benefits for the body such as reducing stress, helping control blood glucose levels,  strengthening the heart, reducing incidence of cognitive impairment, and has a neuroprotective effect on memory function. Research reports that physical activity (whether it be aerobic, resistance training, or high intensity interval training) help control the level of reactive oxygen species which can lead to oxidative stress on the brain therefor possibly impairing memory, learning abilities, and other cognitive functions. The CDC recognizes that physical activity can help prevent premature death, may prevent the development of type 2 diabetes, different cancers, and heart disease. Not only are there are health benefits to physical activity, the CDC also reports that physical activity would decrease health care costs, increase property values, and people who are physically active generally have less sick days. Resource https://www-sciencedirect-com.libproxy.http://www.castillo-fisher.org/, CDC Importance of vegetables To have an overall healthy diet, adult men and women are recommended to consume anywhere from 2-3 cups of vegetables daily. Vegetables provide a wide range of vitamins and minerals such as vitamin A, vitamin C, potassium, and folic acid. According to the [employer], including fruit and vegetables daily may reduce the risk of cardiovascular disease, certain cancers, and other non-communicable diseases. Purpose of hydration: Water makes up over 50% of your total body water, and is part of many organs throughout the body. Water is essential to transport digested nutrients, regulate body temperature, rid the body of waste  products, and protects joints and the spinal cord. When not properly hydrated you will begin to experience headaches, cramps and dizziness. Further dehydration can result in rapid heart rate, shock, oliguria, and may cause seizures.   https://www.merckmanuals.com/home/hormonal-and-metabolic-disordehttps://www.usgs.gov/special-topic/water-science-school/science/water-you-water-and-human-body?qt-science_center_objects=0#qt-science_center_objectsrs/water-balance/about-body-water HistoricalGrowth.gl https://www.stevens.org/ PimpTShirt.fi https://www.health.InvestmentBrowse.at Emotional eating and how to move through those feelings  Educate dpt on grocery shopping and putting meals together   Handouts previously Provided Include  Detailed MyPlate Should I eat Mindful meals meal ideas sheet 21 meal ideas  Learning Style & Readiness for Change Teaching method utilized: Visual & Auditory  Demonstrated degree of understanding via: Teach Back  Barriers to learning/adherence to lifestyle change: emotional eating  Goals Established by Pt Continue to be as active as possible with new move and new environment  Find a therapist to work on emotional reactions and therefor emotional relationship with food Continue to be mindful of making healthier choices during stressful times   MONITORING & EVALUATION Dietary intake, weekly physical activity

## 2022-10-29 ENCOUNTER — Encounter: Payer: Self-pay | Admitting: Neurology

## 2022-10-29 ENCOUNTER — Ambulatory Visit (INDEPENDENT_AMBULATORY_CARE_PROVIDER_SITE_OTHER): Payer: Managed Care, Other (non HMO) | Admitting: Neurology

## 2022-10-29 ENCOUNTER — Telehealth: Payer: Self-pay | Admitting: *Deleted

## 2022-10-29 VITALS — BP 120/67 | HR 64 | Ht 66.0 in | Wt 274.8 lb

## 2022-10-29 DIAGNOSIS — H47012 Ischemic optic neuropathy, left eye: Secondary | ICD-10-CM

## 2022-10-29 DIAGNOSIS — G4733 Obstructive sleep apnea (adult) (pediatric): Secondary | ICD-10-CM

## 2022-10-29 NOTE — Patient Instructions (Addendum)
It was nice to see you again today. I am glad to hear, things are going well with your CPAP therapy. You have adjusted well to treatment with your new machine, and you are compliant with it. You have also fulfilled the insurance-mandated compliance percentage, which is reassuring, so you can get ongoing supplies through your insurance. Please talk to your DME provider about getting replacement supplies on a regular basis. Please be sure to change your filter every month, your mask about every 3 months (nasal pillow inserts monthly), hose about every 6 months, humidifier chamber about yearly. Some restrictions are imposed by your insurance carrier with regard to how frequently you can get certain supplies.  Your DME company can provide further details if necessary.  I will write for a travel CPAP machine.   Please work on weight loss.    Please continue using your CPAP regularly. While your insurance requires that you use CPAP at least 4 hours each night on 70% of the nights, I recommend, that you not skip any nights and use it throughout the night if you can. Getting used to CPAP and staying with the treatment long term does take time and patience and discipline. Untreated obstructive sleep apnea when it is moderate to severe can have an adverse impact on cardiovascular health and raise her risk for heart disease, arrhythmias, hypertension, congestive heart failure, stroke and diabetes. Untreated obstructive sleep apnea causes sleep disruption, nonrestorative sleep, and sleep deprivation. This can have an impact on your day to day functioning and cause daytime sleepiness and impairment of cognitive function, memory loss, mood disturbance, and problems focussing. Using CPAP regularly can improve these symptoms  You should try to establish with a new sleep specialist once you are settled in.

## 2022-10-29 NOTE — Telephone Encounter (Signed)
Done

## 2022-10-29 NOTE — Telephone Encounter (Signed)
Called pt and let him know that [employer] does not do travel cpap per insurance.  It is OOP expense.  He is aware.  Will email the order to him at [email or web address].

## 2022-10-29 NOTE — Progress Notes (Signed)
Subjective:    Patient ID: Shawn Mcknight is a 32 y.o. male.  HPI    Interim history:   Mr. 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  The patient is unaccompanied today.  I first met him at the request of Dr. Leta Baptist on 06/10/2022, at which time the patient reported snoring and excessive daytime somnolence as well as waking up with a sense of gasping for air.  He was advised to proceed with a sleep study.  He had a sleep study through our sleep lab on 08/31/2022.  He had a emergency split sleep study due to severe obstructive sleep disordered breathing.  His baseline AHI was 73.6/h, O2 nadir 63%.  He had severe sleep disruption during the baseline portion of the study.  He responded well to CPAP therapy.  He was started on 5 cm and gradually titrated to a final pressure of 10 cm.  He had a residual AHI of 7/h with nonsupine REM sleep achieved, O2 nadir 88%.  I therefore recommended a home treatment pressure of 11 cm via small fullface mask.  His set up date was 09/29/2022.  He has a ResMed air sense 10 AutoSet machine.  DME company is [employer].  Today, 10/29/2022: I reviewed his CPAP compliance data from 09/28/2022 through 10/27/2022, which is a total of 30 days, during which time he used his machine 29 days with percent use days greater than 4 hours at 93%, indicating excellent compliance with an average usage of 6 hours and 44 minutes, residual AHI at goal at 1.2/h, leak on the higher side with the 95th percentile at 21.3 L/min on a pressure of 11 cm with EPR of 3.  He reports doing well, he is adjusted to treatment and feels improved.   He has noticed an improvement in sleep consolidation and daytime somnolence.  His Epworth sleepiness score is 7 out of 24, previously 12 out of 24.  He is using a fullface mask from [place], [place].  He is motivated to continue with treatment.  He is working on weight loss.  He is going to move to [place], he will be working and living in [place].  The patient's allergies, current medications, family history, past medical history, past social history, past surgical history and problem list were reviewed and updated as appropriate.   Previously:   06/10/22: (He) reports snoring and excessive daytime somnolence, as well as waking up with a sense of gasping for air.  I reviewed your office note from 04/24/2022.  His Epworth sleepiness score is 12 out of 24, fatigue severity score is 29 out of 63.  He has no family history of sleep apnea.  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  He has had significant weight gain over time, in the past 4 years he gained about 70 pounds.  He has some trouble sleeping at times.  He tried melatonin but 5 mg made him sleep too long and too groggy.  He recently got a new mattress.  He usually takes  0.5 mg when needed.  He has no TV in his bedroom, no pets in household, he is single, no kids.  He has had recurrent headaches in the past more so than lately.  He does not drink caffeine daily, he is a non-smoker and does not drink alcohol.  He denies night to night nocturia.  Work-up thus far has included a brain MRI with and without contrast, MR orbits with and without contrast, carotid Doppler ultrasound and echocardiogram.    His Past Medical History Is Significant For: Past Medical History:  Diagnosis Date   Closed fracture of lateral portion of left tibial plateau 11/25/2019   COVID-19    07-2022   GERD (gastroesophageal reflux disease)     IBS (irritable bowel syndrome)    Lactose intolerance    NAFL (nonalcoholic fatty liver)     His Past Surgical History Is Significant For: Past Surgical History:  Procedure Laterality Date   ORIF TIBIA PLATEAU Left 11/26/2019   Procedure: OPEN REDUCTION INTERNAL FIXATION (ORIF) TIBIAL PLATEAU;  Surgeon: Marchia Bond, MD;  Location: [facility];  Service: Orthopedics;  Laterality: Left;   WISDOM TOOTH EXTRACTION      His Family History Is Significant For: Family History  Problem Relation Age of Onset   Thyroid cancer Mother    Hypertension Mother    Liver disease Mother        fatty liver   Healthy Father    Thyroid disease Sister    Thyroid cancer Sister    Esophageal cancer Maternal Grandmother    Stomach cancer Maternal Grandmother    Colon cancer Maternal Grandmother        [name] they removed some of the colon. Not sure if it small or large. but thinks it is the small   Heart attack Maternal Grandfather    Colon polyps Neg Hx    Rectal cancer Neg Hx     His Social History Is Significant For: Social History   Socioeconomic History   Marital status: Single    Spouse name: Not on file   Number of children: Not on file   Years of education: Not on file   Highest education level: Not on file  Occupational History   Not on file  Tobacco Use   Smoking status: Never   Smokeless tobacco: Never  Vaping Use   Vaping Use: Never used  Substance and Sexual Activity   Alcohol use: No   Drug use: Never   Sexual activity: Yes  Other Topics Concern   Not on file  Social History Narrative   Caffiene - Sprite soda .    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    Bachelors degree      Social Determinants of Radio broadcast assistant Strain: Not on file  Food Insecurity: Not on file  Transportation Needs: Not on file  Physical Activity: Not on file  Stress: Not on file  Social Connections: Not on file    His Allergies Are:  Allergies  Allergen  Reactions   Shellfish Allergy Swelling    Lips swelling   Lactose Intolerance (Gi) Other (See Comments)    Bloating, gas  :   His Current Medications Are:  Outpatient Encounter Medications as of 10/29/2022  Medication Sig   aspirin EC 81 MG tablet Take 81 mg by mouth daily. Swallow whole.   rosuvastatin (CRESTOR) 20 MG tablet Take 1 tablet (20 mg total) by mouth daily.   [DISCONTINUED] albuterol (VENTOLIN  HFA) 108 (90 Base) MCG/ACT inhaler Inhale 2 puffs into the lungs every 4 (four) hours as needed for wheezing (chest tightness or cough).   No facility-administered encounter medications on file as of 10/29/2022.  :  Review of Systems:  Out of a complete 14 point review of systems, all are reviewed and negative with the exception of these symptoms as listed below: Spoke  Review of Systems  Neurological:        Initial cpap follow up. ESS 9. Is rested whn has 7-8 hours sleep.      Objective:  Neurological Exam  Physical Exam Physical Examination:   Vitals:   10/29/22 0834  BP: 120/67  Pulse: 64    General Examination: The patient is a very pleasant 32 y.o. male in no acute distress. He appears well-developed and well-nourished and well groomed.   HEENT: Normocephalic, atraumatic, pupils are equal, round and reactive to light, extraocular tracking is good without limitation to gaze excursion or nystagmus noted. Hearing is grossly intact. Face is symmetric with normal facial animation. Speech is clear with no dysarthria noted. There is no hypophonia. There is no lip, neck/head, jaw or voice tremor. Neck is supple with full range of passive and active motion. There are no carotid bruits on auscultation. Oropharynx exam reveals: mild mouth dryness, good dental hygiene and moderate airway crowding.  Tongue protrudes centrally and palate elevates symmetrically.   Chest: Clear to auscultation without wheezing, rhonchi or crackles noted.   Heart: S1+S2+0, regular and normal without  murmurs, rubs or gallops noted.    Abdomen: Soft, non-tender and non-distended.   Extremities: There is no pitting edema in the distal lower extremities bilaterally.    Skin: Warm and dry without trophic changes noted.    Musculoskeletal: exam reveals no obvious joint deformities.    Neurologically:  Mental status: The patient is awake, alert and oriented in all 4 spheres. His immediate and remote memory, attention, language skills and fund of knowledge are appropriate. There is no evidence of aphasia, agnosia, apraxia or anomia. Speech is clear with normal prosody and enunciation. Thought process is linear. Mood is normal and affect is normal.  Cranial nerves II - XII are as described above under HEENT exam.  Motor exam: Normal bulk, strength and tone is noted. There is no obvious action or resting tremor.  Fine motor skills and coordination: grossly intact.  Cerebellar testing: No dysmetria or intention tremor. There is no truncal or gait ataxia.  Sensory exam: intact to light touch in the upper and lower extremities.  Gait, station and balance: He stands easily. No veering to one side is noted. No leaning to one side is noted. Posture is age-appropriate and stance is narrow based. Gait shows normal stride length and normal pace. No problems turning are noted.    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 He had a sleep study through our sleep lab on 08/31/2022.  He had a emergency split sleep study due to severe obstructive sleep disordered breathing.  His baseline AHI was 73.6/h, O2 nadir  63%.  He had severe sleep disruption during the baseline portion  of the study.  He responded well to CPAP therapy.  He started home CPAP therapy at a pressure of 11 cm on 09/29/2022.  He has been on treatment for about a month, he indicates improvement in daytime somnolence and sleep quality as well as sleep consolidation.  He is moving to [place] soon.  He is advised to establish with a new sleep specialist once he has settled in.  He had some additional questions about his vision loss, he is encouraged to send a message to his primary neurologist, Dr. Leta Baptist as well.  We talked about stroke and TIA secondary prevention as well today.  This was an extended visit of over 40 minutes.  We talked about his sleep study results and went over his compliance data in detail.  He is highly commended for his treatment adherence and advised to continue with his CPAP at the current settings.  At some point he may want to explore a nasal mask.  We talked about the importance of cleanliness with the CPAP machine and getting supplies on a regular basis.  He is also interested in purchasing a travel CPAP machine and I placed an order for this into his chart.  If he needs a hardcopy, we can mail him this as well.  We talked about the different interface options.  He is encouraged to continue with current treatment and continue to work on weight loss.  I answered all his questions today and wished him well in his move and new job position.  I spent 40 minutes in total face-to-face time and in reviewing records during pre-charting, more than 50% of which was spent in counseling and coordination of care, reviewing test results, reviewing medications and treatment regimen and/or in discussing or reviewing the diagnosis of OSA, the prognosis and treatment options. Pertinent laboratory and imaging test results that were available during this visit with the patient were reviewed by me and considered in my medical decision making (see  chart for details).

## 2022-11-06 IMAGING — DX DG ABDOMEN ACUTE W/ 1V CHEST
4 series · 4 of 4 positions shown · non-contrast
Comparison: None Available.

CLINICAL DATA: Right-sided abdominal pain, history of constipation
felt tear in right abdomen after lifting luggage 2 weeks ago

EXAM:
DG ABDOMEN ACUTE WITH 1 VIEW CHEST

[chest pa]
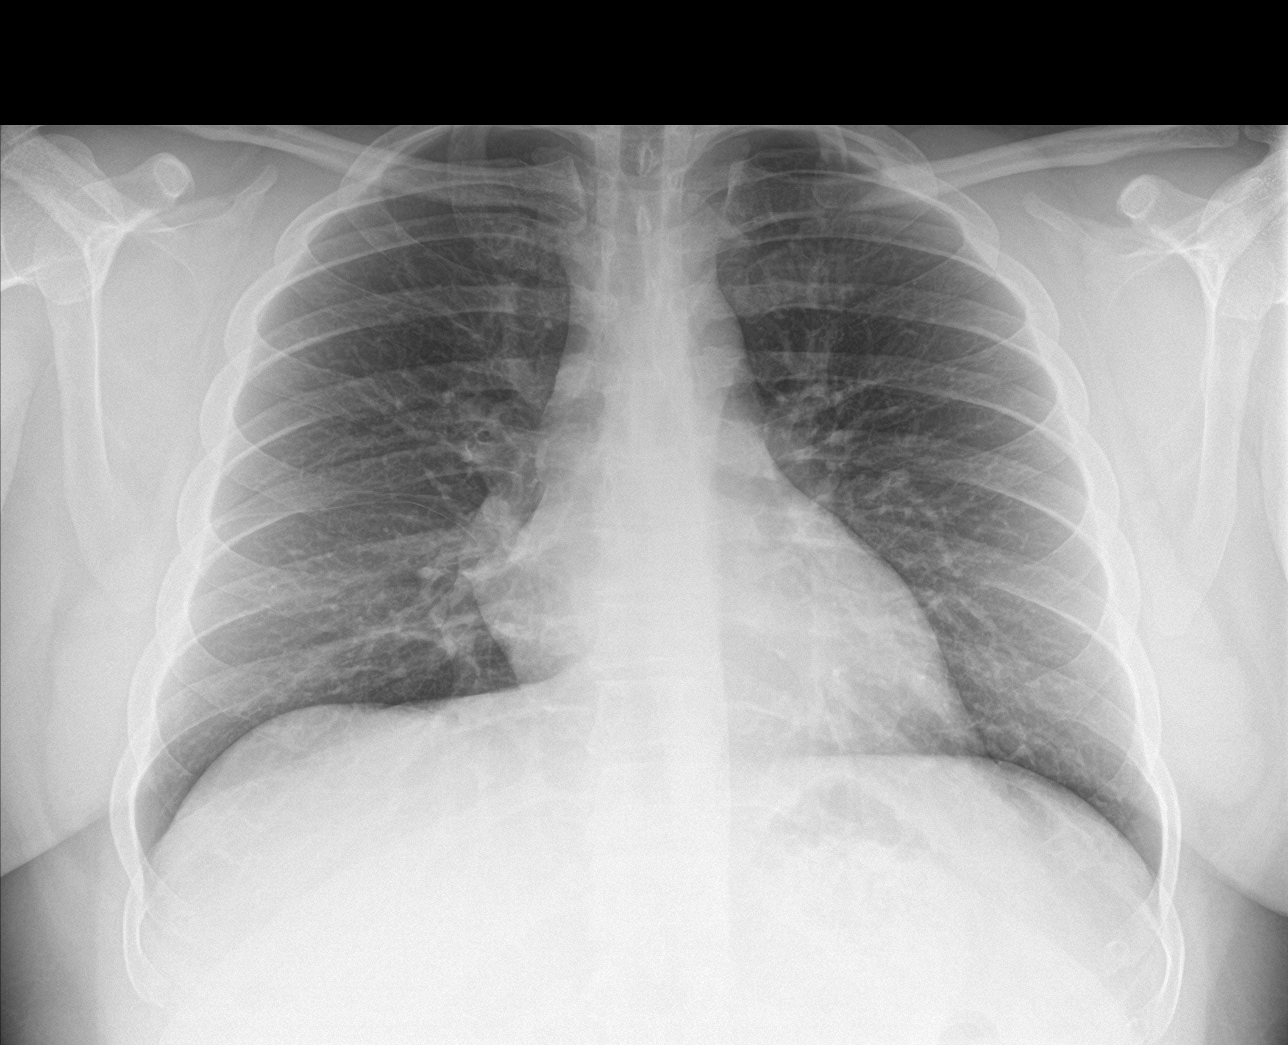

[abdomen erect]
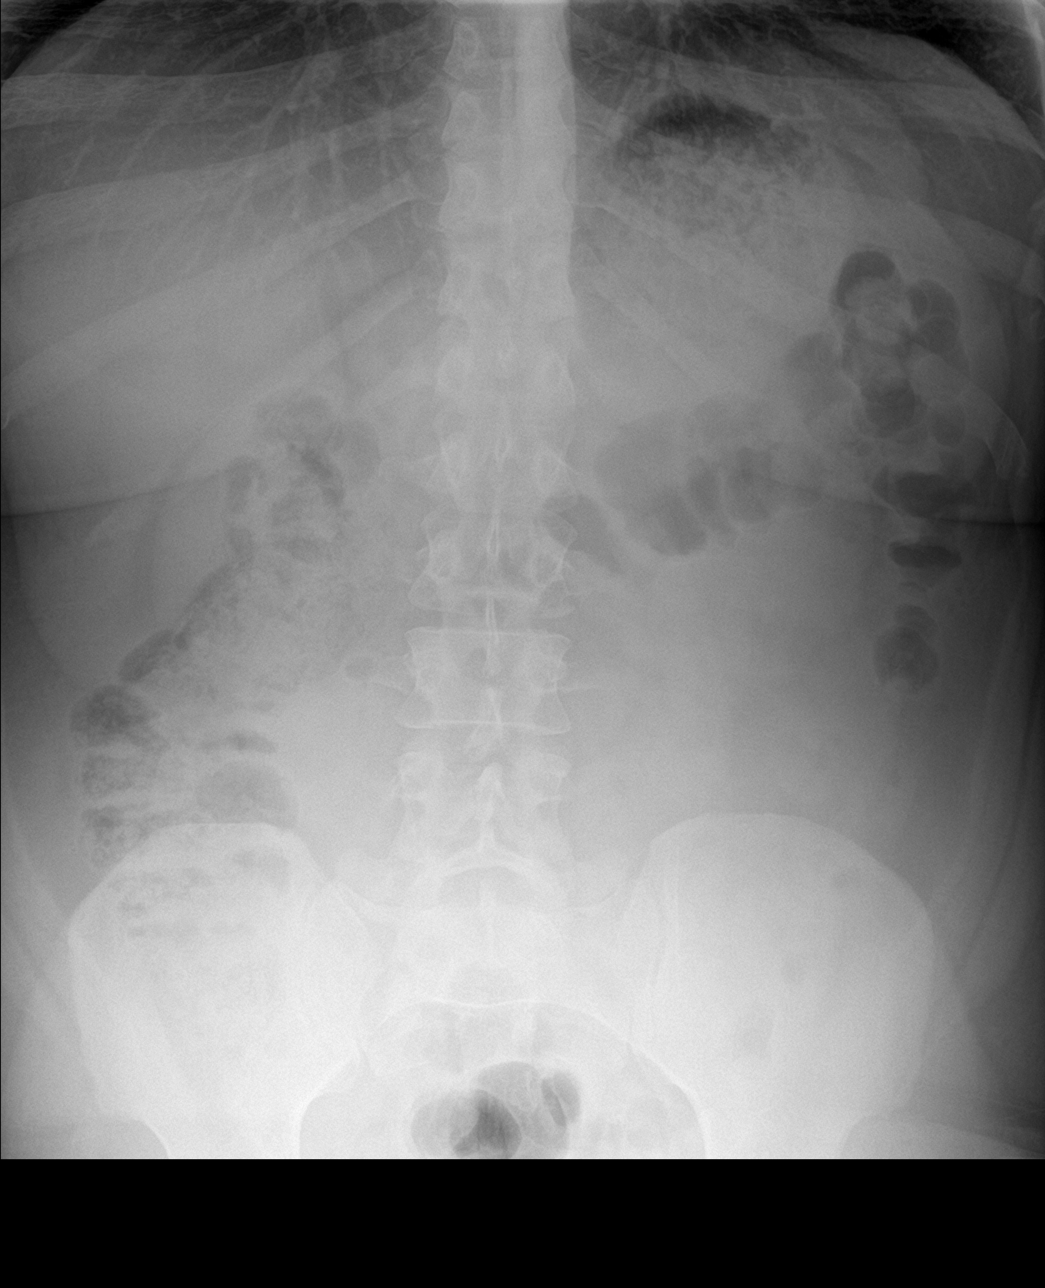

[abdomen supine (1 of 2)]
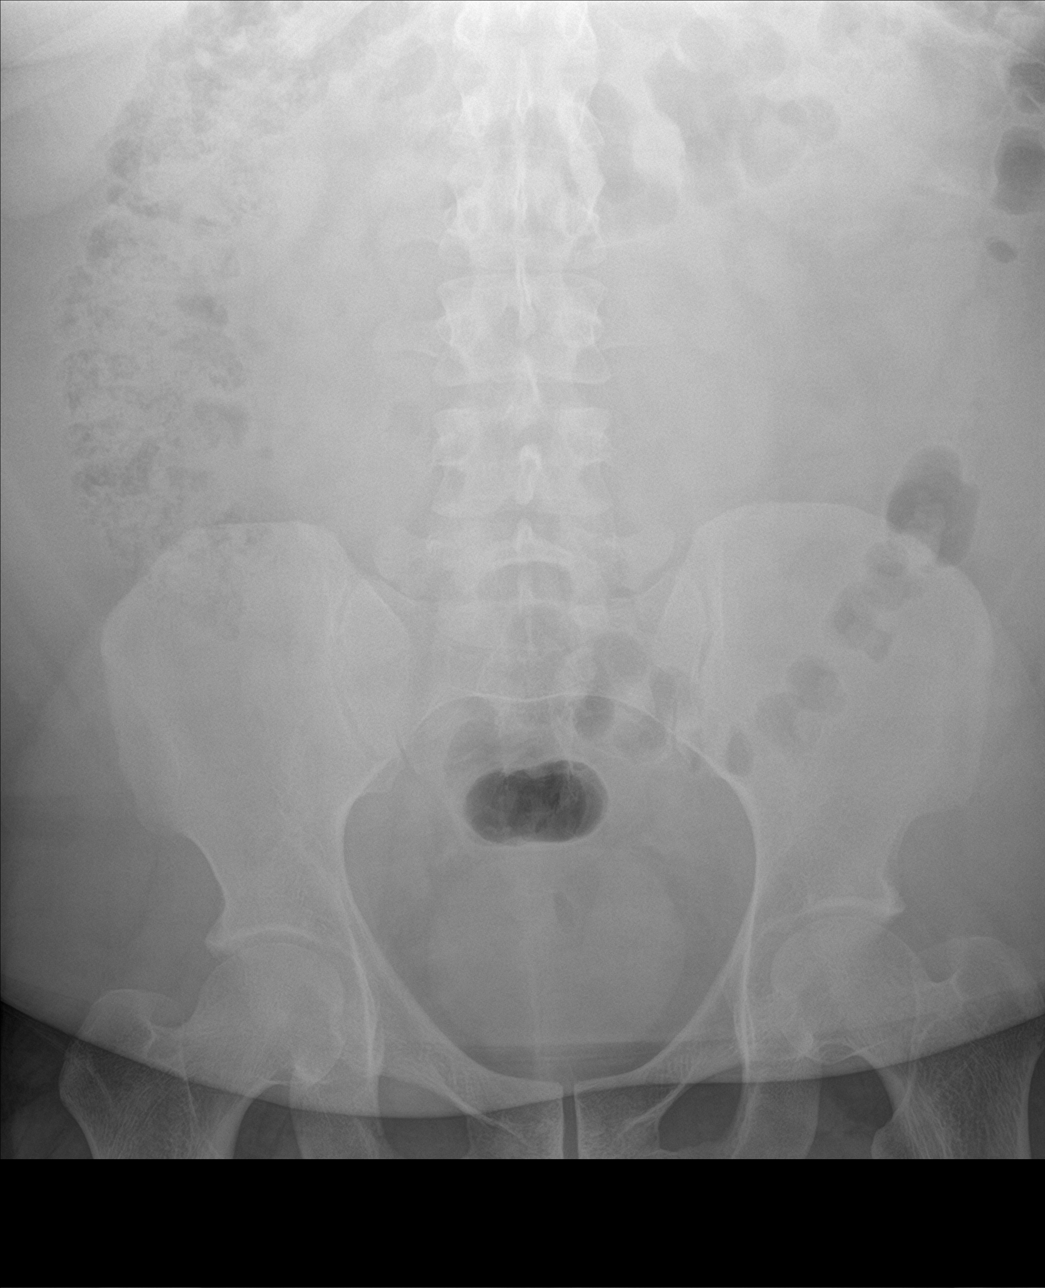

[abdomen supine (2 of 2)]
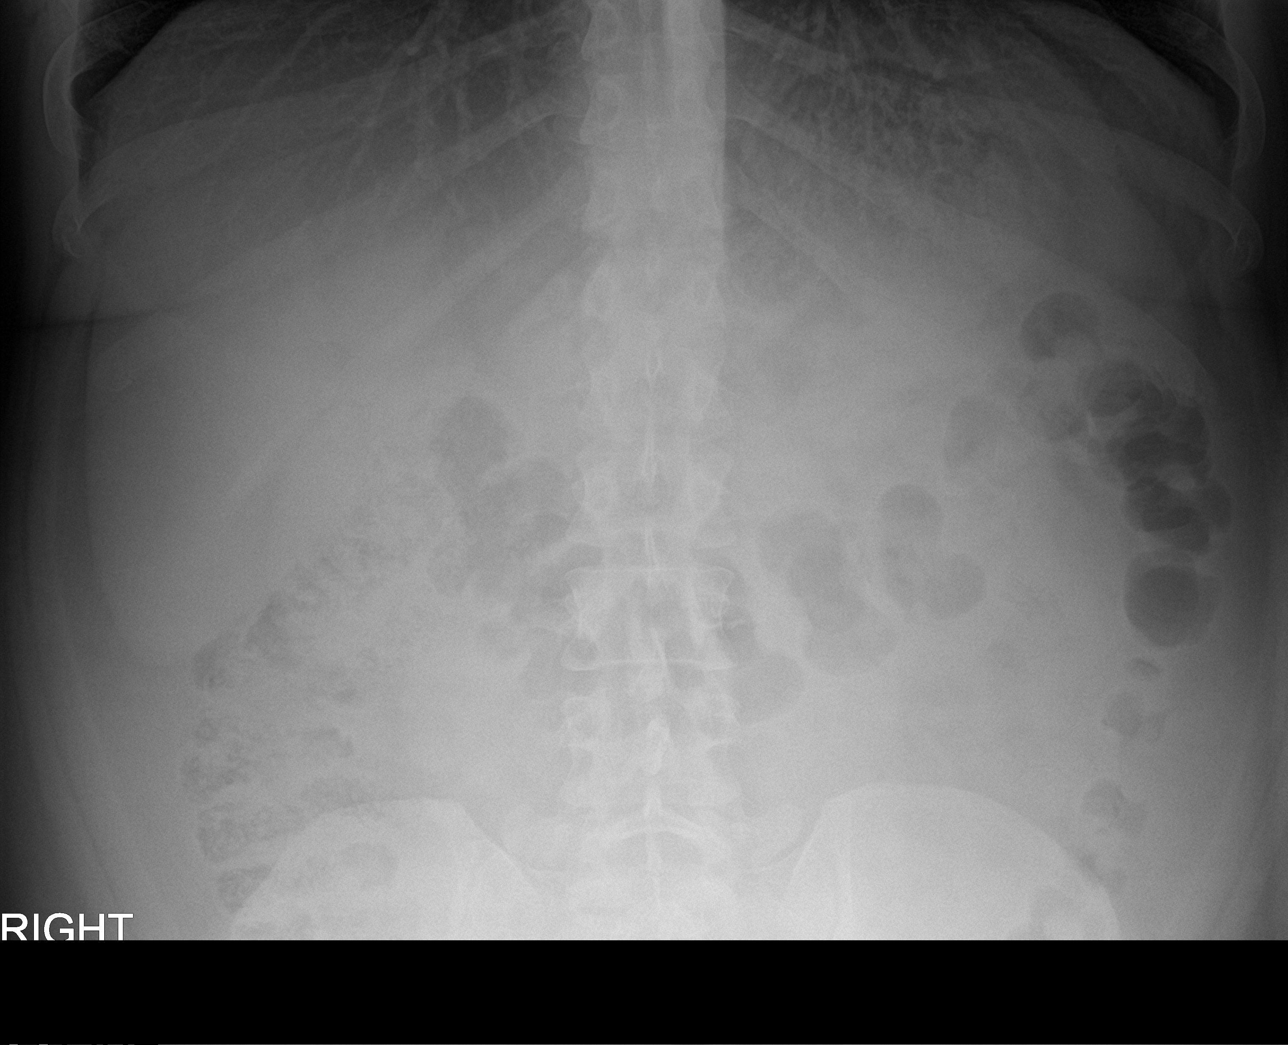

[4 of 4 positions shown; findings below may reference images not displayed]

FINDINGS: Chest: The cardiomediastinal silhouette is normal.

The lungs clear, with no focal consolidation or pulmonary edema.
There is no pleural effusion or pneumothorax.

Abdomen: There is a nonobstructive bowel gas pattern. There is a
mild stool burden in the right colon. There is no free
intraperitoneal air. There is no gross organomegaly or abnormal soft
tissue calcification.

There is no acute osseous abnormality.
IMPRESSION: Mild stool burden in the right hemiabdomen. Otherwise, unremarkable
chest and abdominal radiographs.

## 2023-01-01 ENCOUNTER — Other Ambulatory Visit: Payer: Self-pay | Admitting: Family Medicine

## 2023-02-23 ENCOUNTER — Encounter: Payer: Managed Care, Other (non HMO) | Admitting: Family Medicine
